# Patient Record
Sex: Male | Born: 1960
Health system: Southern US, Community
[De-identification: ages and names within clinical notes are randomized; demographics above are authoritative.]

## PROBLEM LIST (undated history)

## (undated) DIAGNOSIS — I10 Essential (primary) hypertension: Secondary | ICD-10-CM

## (undated) HISTORY — DX: Essential (primary) hypertension: I10

---

## 1996-06-09 HISTORY — PX: VASECTOMY: SHX75

## 2006-11-01 ENCOUNTER — Emergency Department: Payer: Self-pay | Admitting: Internal Medicine

## 2009-05-11 ENCOUNTER — Emergency Department: Payer: Self-pay | Admitting: Emergency Medicine

## 2012-05-21 ENCOUNTER — Emergency Department: Payer: Self-pay | Admitting: Emergency Medicine

## 2012-05-21 LAB — BASIC METABOLIC PANEL
BUN: 17 mg/dL (ref 7–18)
Calcium, Total: 8.6 mg/dL (ref 8.5–10.1)
Chloride: 109 mmol/L — ABNORMAL HIGH (ref 98–107)
Co2: 27 mmol/L (ref 21–32)
Creatinine: 1.37 mg/dL — ABNORMAL HIGH (ref 0.60–1.30)
EGFR (African American): >60
EGFR (Non-African Amer.): >60
Glucose: 118 mg/dL — ABNORMAL HIGH (ref 65–99)
Osmolality: 282 (ref 275–301)
Sodium: 140 mmol/L (ref 136–145)

## 2012-05-21 LAB — CBC
HGB: 15.6 g/dL (ref 13.0–18.0)
MCH: 31.8 pg (ref 26.0–34.0)
MCHC: 35.1 g/dL (ref 32.0–36.0)
Platelet: 172 10*3/uL (ref 150–440)
RBC: 4.9 10*6/uL (ref 4.40–5.90)
RDW: 13.1 % (ref 11.5–14.5)

## 2012-05-21 LAB — TROPONIN I: Troponin-I: <0.02 ng/mL

## 2012-05-21 LAB — CK TOTAL AND CKMB (NOT AT ARMC)
CK, Total: 383 U/L — ABNORMAL HIGH (ref 35–232)
CK-MB: 5.8 ng/mL — ABNORMAL HIGH (ref 0.5–3.6)

## 2013-11-04 LAB — BASIC METABOLIC PANEL
BUN: 13 mg/dL (ref 4–21)
Creatinine: 1.2 mg/dL (ref <=1.3)
GLUCOSE: 99 mg/dL
POTASSIUM: 5 mmol/L (ref 3.4–5.3)
SODIUM: 142 mmol/L (ref 137–147)

## 2013-11-04 LAB — LIPID PANEL
Cholesterol: 127 mg/dL (ref 0–200)
HDL: 35 mg/dL (ref 35–70)
LDL Cholesterol: 68 mg/dL
Triglycerides: 121 mg/dL (ref 40–160)

## 2013-11-04 LAB — HEPATIC FUNCTION PANEL
ALT: 89 U/L — AB (ref 10–40)
AST: 56 U/L — AB (ref 14–40)
Alkaline Phosphatase: 71 U/L (ref 25–125)
BILIRUBIN, TOTAL: 0.6 mg/dL

## 2013-11-04 LAB — PSA: PSA: 0.3

## 2013-11-04 LAB — TSH: TSH: 3.5 u[IU]/mL (ref <=5.90)

## 2013-11-16 ENCOUNTER — Ambulatory Visit: Payer: Self-pay | Admitting: Family Medicine

## 2013-12-14 ENCOUNTER — Ambulatory Visit: Payer: Self-pay | Admitting: Unknown Physician Specialty

## 2014-03-12 DIAGNOSIS — I776 Arteritis, unspecified: Secondary | ICD-10-CM | POA: Insufficient documentation

## 2015-01-04 ENCOUNTER — Other Ambulatory Visit: Payer: Self-pay | Admitting: Family Medicine

## 2015-01-04 DIAGNOSIS — I10 Essential (primary) hypertension: Secondary | ICD-10-CM

## 2015-05-12 ENCOUNTER — Other Ambulatory Visit: Payer: Self-pay | Admitting: Family Medicine

## 2015-05-15 ENCOUNTER — Other Ambulatory Visit: Payer: Self-pay | Admitting: Family Medicine

## 2015-05-15 DIAGNOSIS — I10 Essential (primary) hypertension: Secondary | ICD-10-CM

## 2015-05-15 MED ORDER — LISINOPRIL 10 MG PO TABS: 10.0000 mg | ORAL_TABLET | Freq: Every day | ORAL | Status: DC

## 2015-11-16 IMAGING — CT CT ABDOMEN W/ CM
2 of 5 series · 17 of 46 positions shown, 19 images · IV contrast (isovue)
Comparison: Ultrasound exam from 11/16/2013.

CLINICAL DATA: Right upper quadrant pain.

EXAM:
CT ABDOMEN WITH CONTRAST
TECHNIQUE: Multidetector CT imaging of the abdomen was performed using the
standard protocol following bolus administration of intravenous
contrast.
CONTRAST:  100 cc Isovue 370.

[Series 2: axial soft tissue · axial · 0.78mm/px · z∈[-362,-107]mm · 14 of 59 slices shown, 16 images]
[im 4/59  soft-tissue]
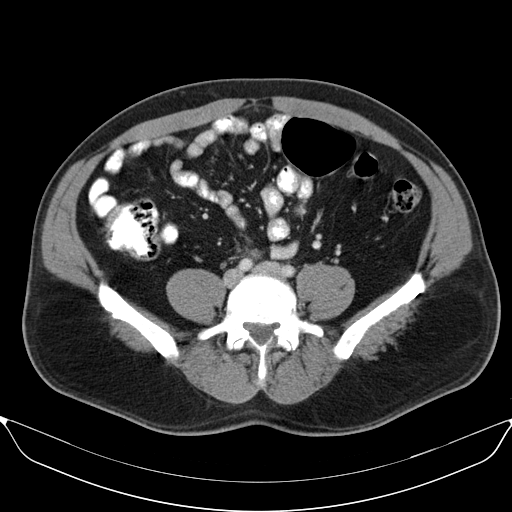
[im 4/59  bone]
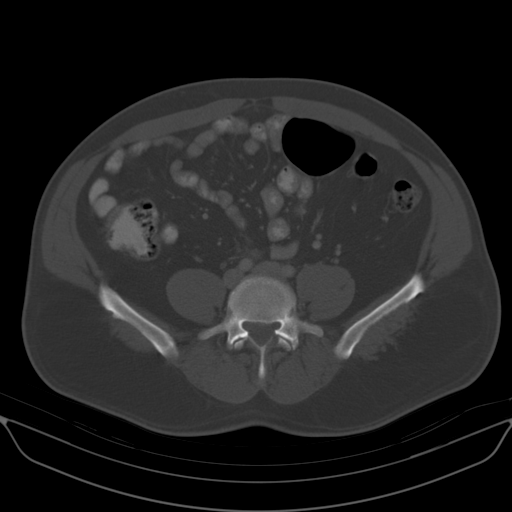
[im 7/59  soft-tissue]
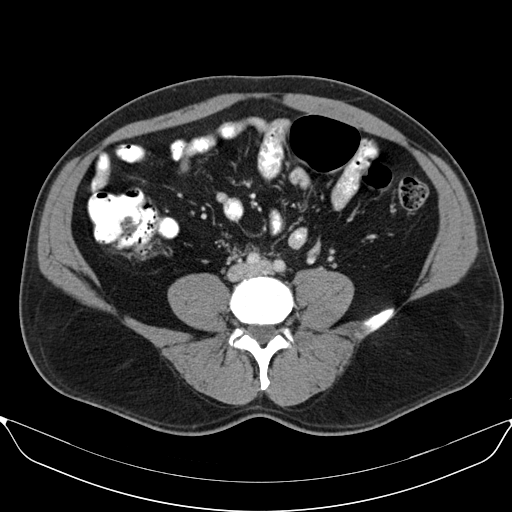
[im 11/59  soft-tissue]
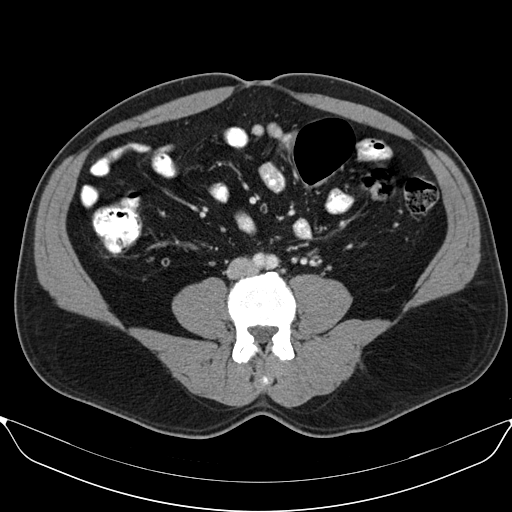
[im 18/59  soft-tissue]
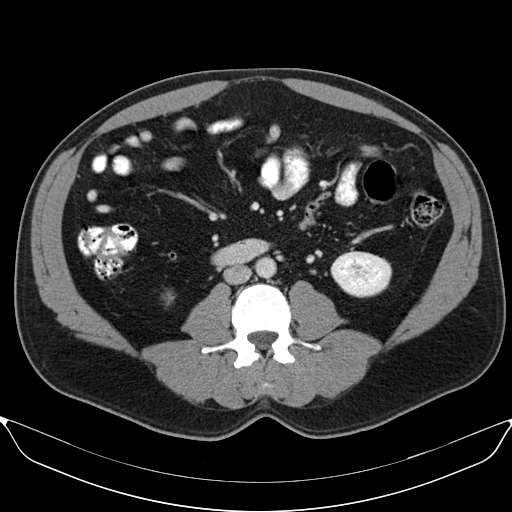
[im 21/59  soft-tissue]
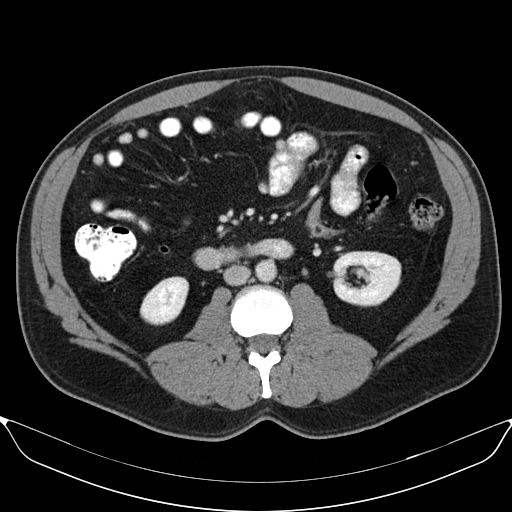
[im 24/59  soft-tissue]
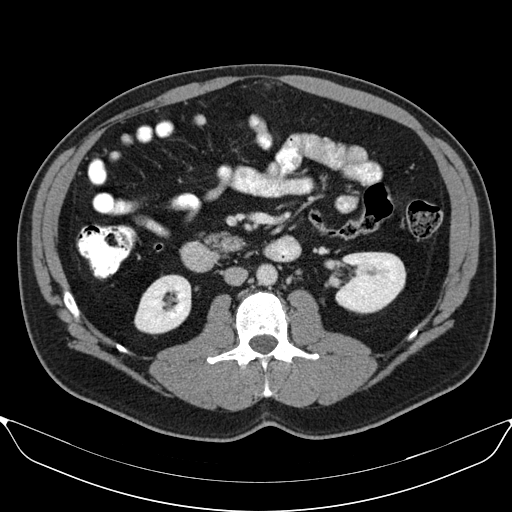
[im 28/59  soft-tissue]
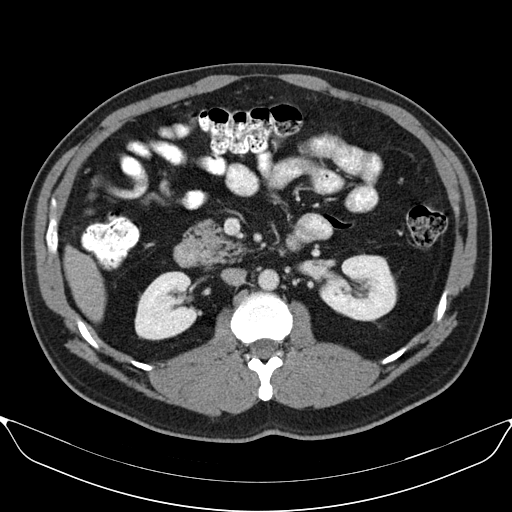
[im 31/59  soft-tissue]
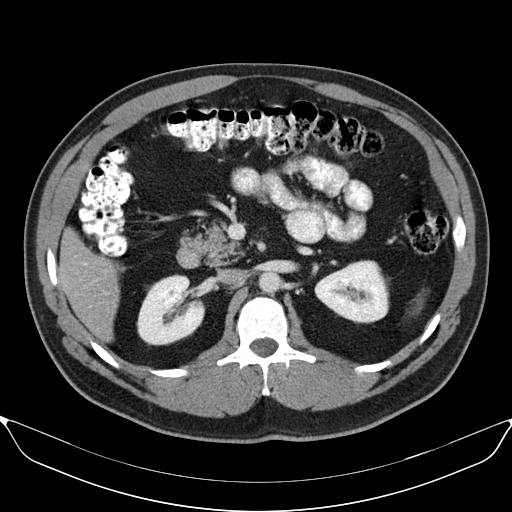
[im 35/59  soft-tissue]
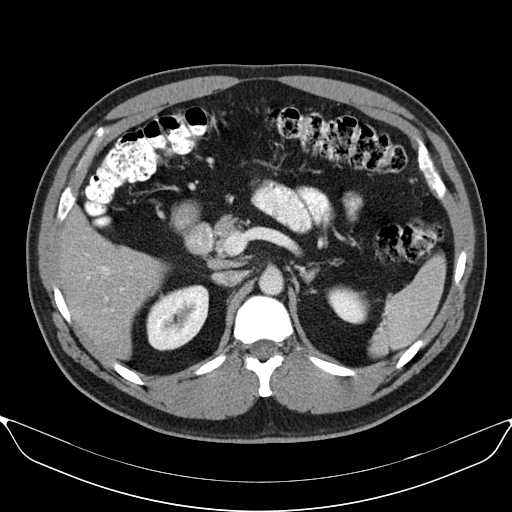
[im 35/59  bone]
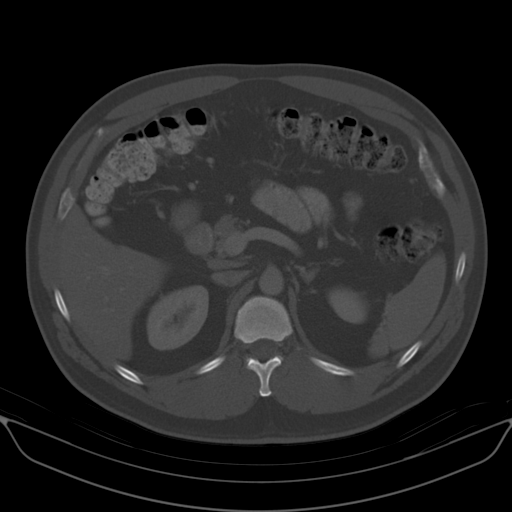
[im 38/59  soft-tissue]
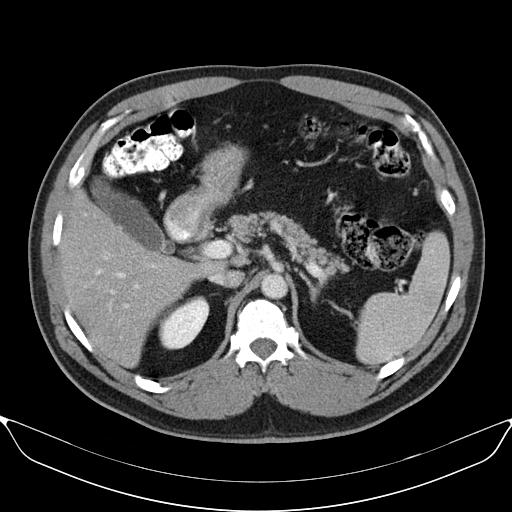
[im 45/59  soft-tissue]
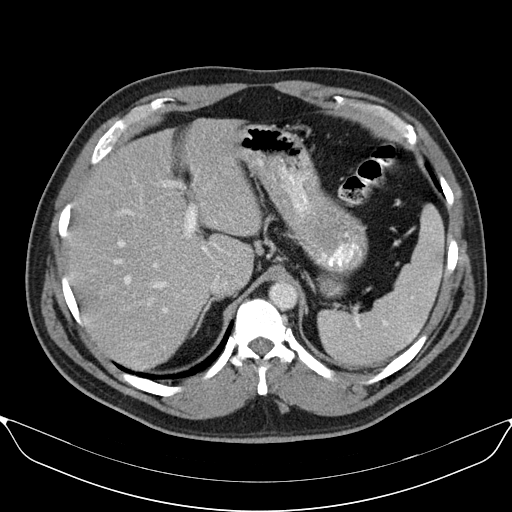
[im 48/59  soft-tissue]
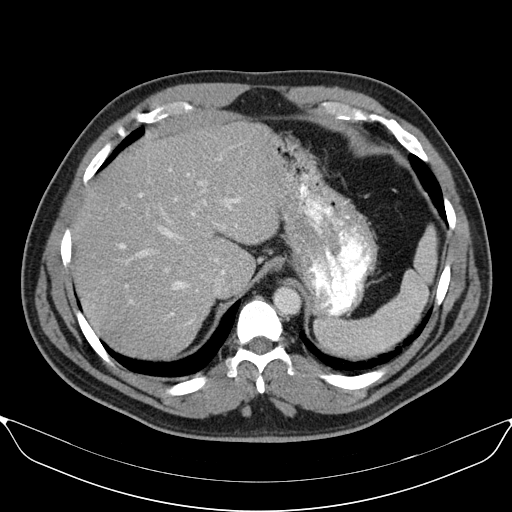
[im 52/59  soft-tissue]
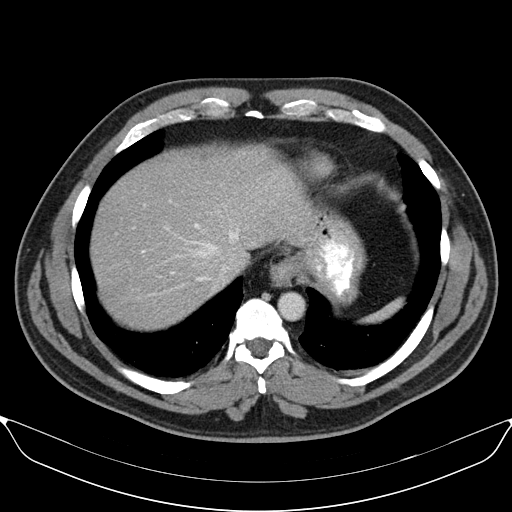
[im 55/59  soft-tissue]
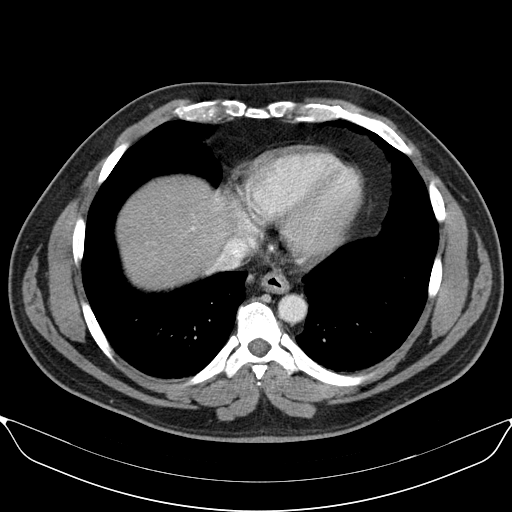

[Series 602: coronal · coronal · 0.78mm/px · 3 of 119 slices shown]
[im 40/119  soft-tissue]
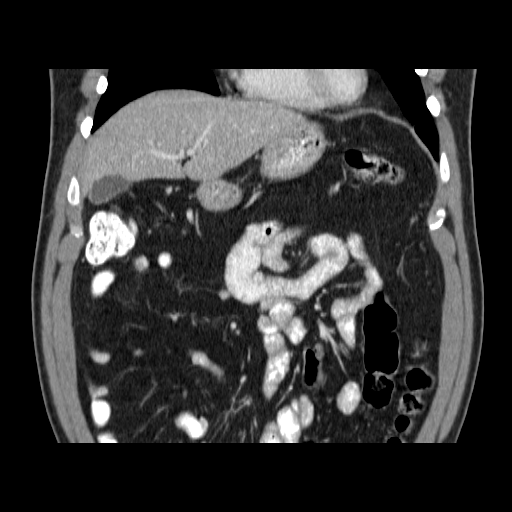
[im 53/119  soft-tissue]
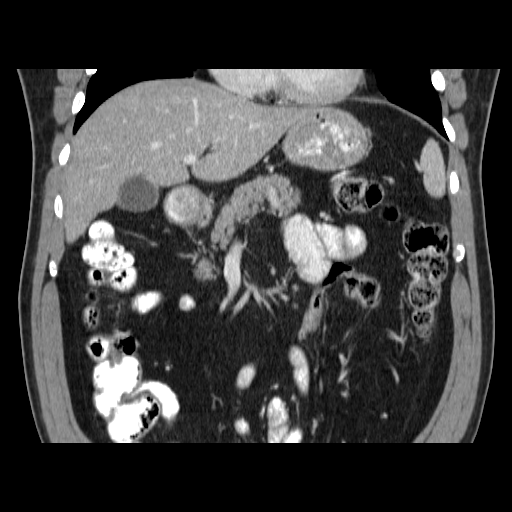
[im 66/119  soft-tissue]
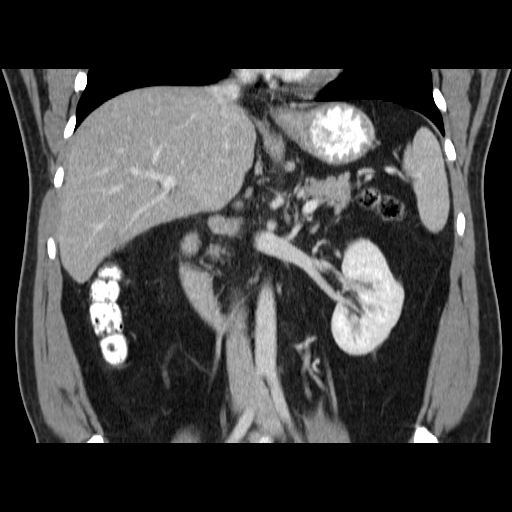

[17 of 46 positions shown; findings below may reference images not displayed]

FINDINGS: Lung Bases: Move

Liver: Normal size at 15.5 cm craniocaudal length. No focal
intraparenchymal abnormality.

Spleen: Normal.

Stomach: Normal appearance for the degree of distension.

Pancreas: No focal mass lesion. No dilatation of the main pancreatic
duct. No peripancreatic edema or inflammation.

Gallbladder/Biliary: No evidence for gallstones. No intra or
extrahepatic biliary duct dilatation.

Kidneys/Adrenals: No adrenal nodule or mass. No enhancing mass
lesion in either kidney. No hydronephrosis.

Bowel Loops: Duodenum is normal. No evidence for small bowel
obstruction within the visualized abdomen. Terminal ileum and
appendix are located cranial to the right iliac crest and are well
visualized with normal features on today's study. The abdominal
segments of the colon are normal.

Nodes: No abdominal lymphadenopathy.

Vasculature: No abdominal aortic aneurysm.

Bones/Musculoskeletal: Bone windows reveal no worrisome lytic or
sclerotic osseous lesions.

Body Wall: No evidence for abdominal wall hernia.

Other: No intraperitoneal free fluid in the abdomen.
IMPRESSION: No findings in the abdomen explain the patient's history of right
upper quadrant pain.

## 2015-12-03 DIAGNOSIS — I10 Essential (primary) hypertension: Secondary | ICD-10-CM | POA: Insufficient documentation

## 2015-12-03 DIAGNOSIS — B009 Herpesviral infection, unspecified: Secondary | ICD-10-CM | POA: Insufficient documentation

## 2015-12-03 DIAGNOSIS — R202 Paresthesia of skin: Secondary | ICD-10-CM | POA: Insufficient documentation

## 2015-12-03 DIAGNOSIS — R748 Abnormal levels of other serum enzymes: Secondary | ICD-10-CM | POA: Insufficient documentation

## 2015-12-03 DIAGNOSIS — Z8669 Personal history of other diseases of the nervous system and sense organs: Secondary | ICD-10-CM | POA: Insufficient documentation

## 2015-12-03 DIAGNOSIS — L309 Dermatitis, unspecified: Secondary | ICD-10-CM | POA: Insufficient documentation

## 2015-12-03 DIAGNOSIS — K219 Gastro-esophageal reflux disease without esophagitis: Secondary | ICD-10-CM | POA: Insufficient documentation

## 2015-12-03 DIAGNOSIS — I2510 Atherosclerotic heart disease of native coronary artery without angina pectoris: Secondary | ICD-10-CM | POA: Insufficient documentation

## 2015-12-03 DIAGNOSIS — Z8349 Family history of other endocrine, nutritional and metabolic diseases: Secondary | ICD-10-CM | POA: Insufficient documentation

## 2016-01-09 ENCOUNTER — Emergency Department
Admission: EM | Admit: 2016-01-09 | Discharge: 2016-01-09 | Disposition: A | Discharge status: Home / Self Care | Payer: 59 | Attending: Emergency Medicine | Admitting: Emergency Medicine

## 2016-01-09 ENCOUNTER — Emergency Department: Payer: 59

## 2016-01-09 ENCOUNTER — Encounter: Payer: Self-pay | Admitting: *Deleted

## 2016-01-09 DIAGNOSIS — W11XXXA Fall on and from ladder, initial encounter: Secondary | ICD-10-CM | POA: Diagnosis not present

## 2016-01-09 DIAGNOSIS — S32009A Unspecified fracture of unspecified lumbar vertebra, initial encounter for closed fracture: Secondary | ICD-10-CM | POA: Diagnosis not present

## 2016-01-09 DIAGNOSIS — Z7982 Long term (current) use of aspirin: Secondary | ICD-10-CM | POA: Diagnosis not present

## 2016-01-09 DIAGNOSIS — Y929 Unspecified place or not applicable: Secondary | ICD-10-CM | POA: Insufficient documentation

## 2016-01-09 DIAGNOSIS — I10 Essential (primary) hypertension: Secondary | ICD-10-CM | POA: Insufficient documentation

## 2016-01-09 DIAGNOSIS — Y999 Unspecified external cause status: Secondary | ICD-10-CM | POA: Insufficient documentation

## 2016-01-09 DIAGNOSIS — Z79899 Other long term (current) drug therapy: Secondary | ICD-10-CM | POA: Diagnosis not present

## 2016-01-09 DIAGNOSIS — Y939 Activity, unspecified: Secondary | ICD-10-CM | POA: Insufficient documentation

## 2016-01-09 DIAGNOSIS — S3992XA Unspecified injury of lower back, initial encounter: Secondary | ICD-10-CM | POA: Diagnosis present

## 2016-01-09 LAB — URINALYSIS COMPLETE WITH MICROSCOPIC (ARMC ONLY)
BACTERIA UA: NONE SEEN
BILIRUBIN URINE: NEGATIVE
GLUCOSE, UA: 50 mg/dL — AB
HGB URINE DIPSTICK: NEGATIVE
Ketones, ur: NEGATIVE mg/dL
LEUKOCYTES UA: NEGATIVE
NITRITE: NEGATIVE
Protein, ur: NEGATIVE mg/dL
RBC / HPF: NONE SEEN RBC/hpf (ref 0–5)
SPECIFIC GRAVITY, URINE: 1.023 (ref 1.005–1.030)
Squamous Epithelial / LPF: NONE SEEN
pH: 6 (ref 5.0–8.0)

## 2016-01-09 MED ORDER — HYDROMORPHONE HCL 2 MG PO TABS: 2.0000 mg | ORAL_TABLET | Freq: Two times a day (BID) | ORAL | 0 refills | Status: DC | PRN

## 2016-01-09 MED ORDER — LIDOCAINE 5 % EX PTCH: 1.0000 | MEDICATED_PATCH | CUTANEOUS | 0 refills | Status: AC

## 2016-01-09 MED ORDER — LIDOCAINE 5 % EX PTCH
1.0000 | MEDICATED_PATCH | CUTANEOUS | Status: DC
Start: 2016-01-09 — End: 2016-01-10
  Administered 2016-01-09: 1 via TRANSDERMAL
  Filled 2016-01-09: qty 1

## 2016-01-09 MED ORDER — IBUPROFEN 800 MG PO TABS: 800.0000 mg | ORAL_TABLET | Freq: Three times a day (TID) | ORAL | 0 refills | Status: AC | PRN

## 2016-01-09 NOTE — ED Triage Notes (Signed)
Pt was cleaning gutters and fell 10 feet down landing on his back, states back and "kidney " pain, denies any LOC, denies hitting his head, awake upon arrival

## 2016-01-09 NOTE — Discharge Instructions (Signed)
Please return immediately if condition worsens. Please contact her primary physician or the physician you were given for referral. If you have any specialist physicians involved in her treatment and plan please also contact them. Thank you for using [facility]. ° °

## 2016-01-09 NOTE — ED Provider Notes (Signed)
Time Seen: Approximately 1956  I have reviewed the triage notes  Chief Complaint: Fall   History of Present Illness: Jacob Lee is a 55 y.o. male *who presents after a non-syncopal fall from a ladder approximately 10 feet up in the air. Fall was approximately 1 hour prior to arrival. Patient states he fell backwards off a ladder and landed primarily flat on his back and did strike a lawn chair on the way to the ground. He denies any head trauma, neck pain. He was able to get up and ambulate after his fall. He is primarily complaining of pain over the left flank area and lower middle back region. He denies any radiation of pain in the lower extremities. History reviewed. No pertinent past medical history.  Patient Active Problem List   Diagnosis Date Noted  . Abnormal liver enzymes 12/03/2015  . Coronary atherosclerosis 12/03/2015  . Dermatitis, eczematoid 12/03/2015  . Eczema of hand 12/03/2015  . Family history of hypothyroidism 12/03/2015  . Acid reflux 12/03/2015  . H/O Bell's palsy 12/03/2015  . Herpes simplex 12/03/2015  . BP (high blood pressure) 12/03/2015  . Leg paresthesia 12/03/2015  . Vasculitis ([place]) 03/12/2014    Past Surgical History:  Procedure Laterality Date  . VASECTOMY  1998    Past Surgical History:  Procedure Laterality Date  . VASECTOMY  1998    Current Outpatient Rx  . Order #: [id number] Class: Historical Med  . Order #: [id number] Class: Historical Med  . Order #: [id number] Class: Normal    Allergies:  Cefdinir; Codeine; and Sudafed  [pseudoephedrine hcl]  Family History: Family History  Problem Relation Age of Onset  . Heart disease Mother   . Allergic rhinitis Mother   . Emphysema Mother   . Heart disease Father   . Hypertension Father   . Diabetes Father   . Allergic rhinitis Father   . Heart disease Brother   . Cancer Maternal Uncle     prostate cancer  . Hypertension Brother     Social History: Social History   Substance Use Topics  . Smoking status: Never Smoker  . Smokeless tobacco: Not on file  . Alcohol use No     Review of Systems:   10 point review of systems was performed and was otherwise negative:  Constitutional: No fever Eyes: No visual disturbances ENT: No sore throat, ear pain Cardiac: No chest pain Respiratory: No shortness of breath, wheezing, or stridor Abdomen: No abdominal pain, no vomiting, No diarrhea Endocrine: No weight loss, No night sweats Extremities: No peripheral edema, cyanosis Skin: No rashes, easy bruising Neurologic: No focal weakness, trouble with speech or swollowing Urologic: No dysuria, Hematuria, or urinary frequency   Physical Exam:  ED Triage Vitals [01/09/16 1807]  Enc Vitals Group     BP 133/88     Pulse Rate 71     Resp 18     Temp 97.6 F (36.4 C)     Temp Source Oral     SpO2 96 %     Weight 224 lb (101.6 kg)     Height 5\' 8"  (1.727 m)     Head Circumference      Peak Flow      Pain Score 8     Pain Loc      Pain Edu?      Excl. in [place]?     General: Awake , Alert , and Oriented times 3; GCS 15 Trauma score 16 Head: Normal cephalic , atraumatic  Eyes: Pupils equal , round, reactive to light Nose/Throat: No nasal drainage, patent upper airway without erythema or exudate.  Neck: Supple, Full range of motion, No anterior adenopathy or palpable thyroid masses. No midline cervical spine pain Lungs: Clear to ascultation without wheezes , rhonchi, or rales Heart: Regular rate, regular rhythm without murmurs , gallops , or rubs Abdomen: Soft, non tender without rebound, guarding , or rigidity; bowel sounds positive and symmetric in all 4 quadrants. No organomegaly .        Extremities: Full range of motion of both lower extremities without any pain or crepitus Neurologic: normal ambulation, Motor symmetric without deficits, sensory intact Skin: warm, dry, no rashes Examination of the back shows some pain over the left flank area along  with lower middle lumbar region without crepitus or step-off noted Pelvis is stable Labs:   All laboratory work was reviewed including any pertinent negatives or positives listed below:  Labs Reviewed  [place] ([place])    Radiology: CLINICAL DATA:  55 year old male who fell while cleaning gutters landing on to back. Initial encounter.  EXAM: LUMBAR SPINE - 2-3 VIEW  COMPARISON:  Thoracic spine series from today reported separately. CT Abdomen 12/14/2013.  FINDINGS: Normal lumbar segmentation. Straightening of lumbar lordosis. Otherwise normal vertebral height and alignment. Relatively preserved disc spaces. Questionable nondisplaced left L1 transverse process fracture. No other lumbar fracture identified. Sacral ala and SI joints appear intact.  IMPRESSION: 1. Suspected nondisplaced left L1 transverse process fracture (a stable injury). 2. No other acute fracture or dislocation identified in the lumbar spine. EXAM: THORACIC SPINE 2 VIEWS  COMPARISON:  Portable chest 05/21/2012.  FINDINGS: Bone mineralization is within normal limits. Normal thoracic segmentation. Cervicothoracic junction alignment is within normal limits. Normal thoracic vertebral height and alignment aside from mild dextro convex curvature. Posterior ribs appear intact. Grossly normal visualized thoracic visceral contours. Visible upper lumbar levels appear intact.  IMPRESSION: No acute fracture or listhesis identified in the thoracic spine.   Electronically Signed   By: Genevie Ann M.D.   On: 01/09/2016 19:10 I personally reviewed the radiologic studies     ED Course:   Patient's ED course was uneventful. He refuses most of the pain medication was offered and accept for lidocaine patch which we applied it only gave him some mild pain relief. Neurologic signs and his urine was negative for blood and I felt it was unlikely that he had some form of  intraperitoneal injury at this time.    Clinical Course     Assessment: * Transverse process fracture L1     Plan: * Outpatient Prescriptions for ibuprofen, lidocaine patch, and Dilaudid Patient was advised to return immediately if condition worsens. Patient was advised to follow up with their primary care physician or other specialized physicians involved in their outpatient care. The patient and/or family member/power of attorney had laboratory results reviewed at the bedside. All questions and concerns were addressed and appropriate discharge instructions were distributed by the nursing staff.             Jacob Larsen, MD 01/09/16 2102

## 2016-01-14 ENCOUNTER — Encounter: Payer: Self-pay | Admitting: Family Medicine

## 2016-01-14 ENCOUNTER — Ambulatory Visit (INDEPENDENT_AMBULATORY_CARE_PROVIDER_SITE_OTHER): Payer: 59 | Admitting: Family Medicine

## 2016-01-14 VITALS — BP 126/78 | HR 82 | Temp 98.0°F | Resp 16 | Wt 215.2 lb

## 2016-01-14 DIAGNOSIS — S32008D Other fracture of unspecified lumbar vertebra, subsequent encounter for fracture with routine healing: Secondary | ICD-10-CM | POA: Diagnosis not present

## 2016-01-14 MED ORDER — OXYCODONE HCL 5 MG PO TABS: ORAL_TABLET | ORAL | 0 refills | Status: DC

## 2016-01-14 NOTE — Progress Notes (Signed)
Subjective:     Patient ID: Jacob Lee, male   DOB: 21-Aug-1960, 55 y.o.   MRN: [id number]  HPI  Chief Complaint  Patient presents with  . Hospitalization Follow-up    Patient comes in office today for hospital follow up. Patient was seen at [facility] on 01/09/16 after sustaining a 74foot fall off a ladder. Patient states that he was cleaning gutters and ladder was half resting on concrete and on ground, patient states he over reached and had fell straight back due to imbalance of ladder.   States he continues to significant pain with movement despite taking ibuprofen 800 mg 3 x day and Tylenol once a day. States pain medication from the ER made him nauseous and he discontinued it.   Review of Systems     Objective:   Physical Exam  Constitutional: He appears well-developed and well-nourished. He appears distressed (moderate pain with movement; standing on presentation).  Musculoskeletal:  Localizes pain to his left lumbar area coincident with x-ray findings       Assessment:    1. Lumbar transverse process fracture, with routine healing, subsequent encounter - oxyCODONE (OXY IR/ROXICODONE) 5 MG immediate release tablet; 1/2 to one every 4-6 hours as needed for pain  Dispense: 30 tablet; Refill: 0    Plan:    Continue nsaid's and  Tylenol up to 3000 mg./24 hours. Work excuse for 8/-17-8/14 pending next f/u visit.

## 2016-01-14 NOTE — Patient Instructions (Signed)
Continue ibuprofen 800 mg 3 x day with food; May add Tylenol extra strength 500 mg. Two pills 3 x day (do not exceed 3000 mg./day)

## 2016-01-18 ENCOUNTER — Encounter: Payer: Self-pay | Admitting: Family Medicine

## 2016-01-18 ENCOUNTER — Ambulatory Visit (INDEPENDENT_AMBULATORY_CARE_PROVIDER_SITE_OTHER): Payer: 59 | Admitting: Family Medicine

## 2016-01-18 VITALS — BP 122/84 | HR 83 | Temp 98.3°F | Resp 15 | Wt 210.6 lb

## 2016-01-18 DIAGNOSIS — S32008D Other fracture of unspecified lumbar vertebra, subsequent encounter for fracture with routine healing: Secondary | ICD-10-CM

## 2016-01-18 NOTE — Patient Instructions (Signed)
We will call you about the referral. 

## 2016-01-18 NOTE — Progress Notes (Signed)
Subjective:     Patient ID: Jacob Lee, male   DOB: 1961/03/06, 55 y.o.   MRN: [id number]  HPI  Chief Complaint  Patient presents with  . Back Pain    Patient returns for four day follow up, at last visit patient was started on oxycodone 5mg  and advised to continue Nsaids & Tylenol. Patient states that he is in pain and is worse when he takes deep breaths, patient states that he has sharp pain in his lower back on the left side and is unable to bend down.   He is anxious to return to work as soon as possible and asks about a brace for his back. Tolerating oxycodone at 1/2 pill without significant nausea.   Review of Systems     Objective:   Physical Exam  Constitutional: He appears well-developed and well-nourished. No distress (standing during visit.).       Assessment:    1. Lumbar transverse process fracture, with routine healing, subsequent encounter - Ambulatory referral to Orthopedic Surgery    Plan:    work excuse from 8/14-8/21/17 pending orthopedic evaluation. Continue pain medication as needed. ADL's within the limitation of his pain.

## 2016-01-23 ENCOUNTER — Telehealth: Payer: Self-pay | Admitting: Family Medicine

## 2016-01-23 NOTE — Telephone Encounter (Signed)
Pt is requesting a note to return to work.I advised him you would probably want the orthopaedic doctor to make this decision.Pt would like call today.Pt is aware you do leave early on Wednesdays

## 2016-01-23 NOTE — Telephone Encounter (Signed)
Patient was last seen by you on 01/18/16. KW

## 2016-01-23 NOTE — Telephone Encounter (Signed)
States he is going back to orthopedics tomorrow to get clearance to return to work. Reports he is moving much better and is not on on ibuprofen or narcotic pain medication.

## 2016-02-07 ENCOUNTER — Other Ambulatory Visit: Payer: Self-pay | Admitting: Family Medicine

## 2016-02-07 DIAGNOSIS — I10 Essential (primary) hypertension: Secondary | ICD-10-CM

## 2016-10-05 ENCOUNTER — Other Ambulatory Visit: Payer: Self-pay | Admitting: Family Medicine

## 2016-10-05 DIAGNOSIS — I10 Essential (primary) hypertension: Secondary | ICD-10-CM

## 2016-10-09 ENCOUNTER — Encounter: Payer: Self-pay | Admitting: Family Medicine

## 2016-10-09 ENCOUNTER — Ambulatory Visit (INDEPENDENT_AMBULATORY_CARE_PROVIDER_SITE_OTHER): Payer: 59 | Admitting: Family Medicine

## 2016-10-09 VITALS — BP 124/80 | HR 81 | Temp 98.3°F | Resp 16 | Wt 219.2 lb

## 2016-10-09 DIAGNOSIS — Z13220 Encounter for screening for lipoid disorders: Secondary | ICD-10-CM | POA: Diagnosis not present

## 2016-10-09 DIAGNOSIS — L720 Epidermal cyst: Secondary | ICD-10-CM | POA: Diagnosis not present

## 2016-10-09 DIAGNOSIS — I10 Essential (primary) hypertension: Secondary | ICD-10-CM

## 2016-10-09 DIAGNOSIS — Z125 Encounter for screening for malignant neoplasm of prostate: Secondary | ICD-10-CM

## 2016-10-09 NOTE — Progress Notes (Signed)
Subjective:     Patient ID: Jacob Lee, male   DOB: 01/22/1961, 56 y.o.   MRN: [id number]  HPI  Chief Complaint  Patient presents with  . Skin Problem    Patient comes in office today with conerns of knot/bump that appeared on the inside of his buttock on the right side 3 weeks ago. Patient denies changes to area getting larger or painful.   . Hypertension    Pastient comes in office for follow up, last office visit was 11/01/13. Patient blood pressure at visit was 112/84, patient was advised to continue Lisinopril 10mg . Patient reports good compliance and tolerance.   States knot is painless. Discussed updating labs.   Review of Systems  Respiratory: Negative for shortness of breath.   Cardiovascular: Negative for chest pain and palpitations.  Gastrointestinal:       Reports colonoscopy 2 years ago.  Genitourinary:       No nocturia       Objective:   Physical Exam  Constitutional: He appears well-developed and well-nourished. No distress.  Cardiovascular: Normal rate and regular rhythm.   Pulmonary/Chest: Breath sounds normal.  Musculoskeletal: He exhibits no edema (of lower extremities).  Skin:  Right inner buttock with 1 cm subcutaneous, mobile cyst.       Assessment:    1. Epidermoid cyst of skin: discussed observation; signs and symptoms of inflammation/infection  2. Essential hypertension - Comprehensive metabolic panel  3. Screening for cholesterol level - Lipid panel  4. Screening for prostate cancer - PSA    Plan:    Further f/u pending lab work

## 2016-10-09 NOTE — Patient Instructions (Signed)
Please get fasting labs

## 2016-10-24 ENCOUNTER — Encounter: Payer: Self-pay | Admitting: Family Medicine

## 2016-10-24 DIAGNOSIS — K7581 Nonalcoholic steatohepatitis (NASH): Secondary | ICD-10-CM | POA: Insufficient documentation

## 2016-10-24 LAB — COMPREHENSIVE METABOLIC PANEL
A/G RATIO: 1.6 (ref 1.2–2.2)
ALK PHOS: 51 IU/L (ref 39–117)
ALT: 38 IU/L (ref 0–44)
AST: 37 IU/L (ref 0–40)
Albumin: 4.4 g/dL (ref 3.5–5.5)
BUN/Creatinine Ratio: 13 (ref 9–20)
BUN: 17 mg/dL (ref 6–24)
Bilirubin Total: 0.5 mg/dL (ref 0.0–1.2)
CALCIUM: 9.6 mg/dL (ref 8.7–10.2)
CO2: 25 mmol/L (ref 18–29)
Chloride: 102 mmol/L (ref 96–106)
Creatinine, Ser: 1.29 mg/dL — ABNORMAL HIGH (ref 0.76–1.27)
GFR calc Af Amer: 72 mL/min/{1.73_m2} (ref >59)
GFR, EST NON AFRICAN AMERICAN: 62 mL/min/{1.73_m2} (ref >59)
GLOBULIN, TOTAL: 2.7 g/dL (ref 1.5–4.5)
Glucose: 101 mg/dL — ABNORMAL HIGH (ref 65–99)
POTASSIUM: 5.1 mmol/L (ref 3.5–5.2)
SODIUM: 141 mmol/L (ref 134–144)
Total Protein: 7.1 g/dL (ref 6.0–8.5)

## 2016-10-24 LAB — LIPID PANEL
CHOLESTEROL TOTAL: 153 mg/dL (ref 100–199)
Chol/HDL Ratio: 3.9 ratio (ref 0.0–5.0)
HDL: 39 mg/dL — AB (ref >39)
LDL CALC: 92 mg/dL (ref 0–99)
TRIGLYCERIDES: 108 mg/dL (ref 0–149)
VLDL CHOLESTEROL CAL: 22 mg/dL (ref 5–40)

## 2016-10-24 LAB — PSA: Prostate Specific Ag, Serum: 0.4 ng/mL (ref 0.0–4.0)

## 2017-01-31 ENCOUNTER — Other Ambulatory Visit: Payer: Self-pay | Admitting: Family Medicine

## 2017-01-31 DIAGNOSIS — I10 Essential (primary) hypertension: Secondary | ICD-10-CM

## 2017-08-26 ENCOUNTER — Other Ambulatory Visit: Payer: Self-pay | Admitting: Family Medicine

## 2017-08-26 DIAGNOSIS — I10 Essential (primary) hypertension: Secondary | ICD-10-CM

## 2017-08-26 MED ORDER — LISINOPRIL 10 MG PO TABS: 10.0000 mg | ORAL_TABLET | Freq: Every day | ORAL | 1 refills | Status: DC

## 2017-08-26 NOTE — Telephone Encounter (Signed)
[facility] faxed a refill request for a 90-days supply for the following medication. Thanks CC  lisinopril (PRINIVIL,ZESTRIL) 10 MG tablet

## 2017-12-11 IMAGING — CR DG THORACIC SPINE 2V
1 series · 3 of 3 positions shown · non-contrast
Comparison: Portable chest 05/21/2012.

CLINICAL DATA: 55-year-old male who fell while cleaning gutters
landing on to back. Initial encounter.

EXAM:
THORACIC SPINE 2 VIEWS

[Series 1: dg thoracic spine 2 view · 0.14mm/px · 3 of 3 slices shown]
[im 1/3]
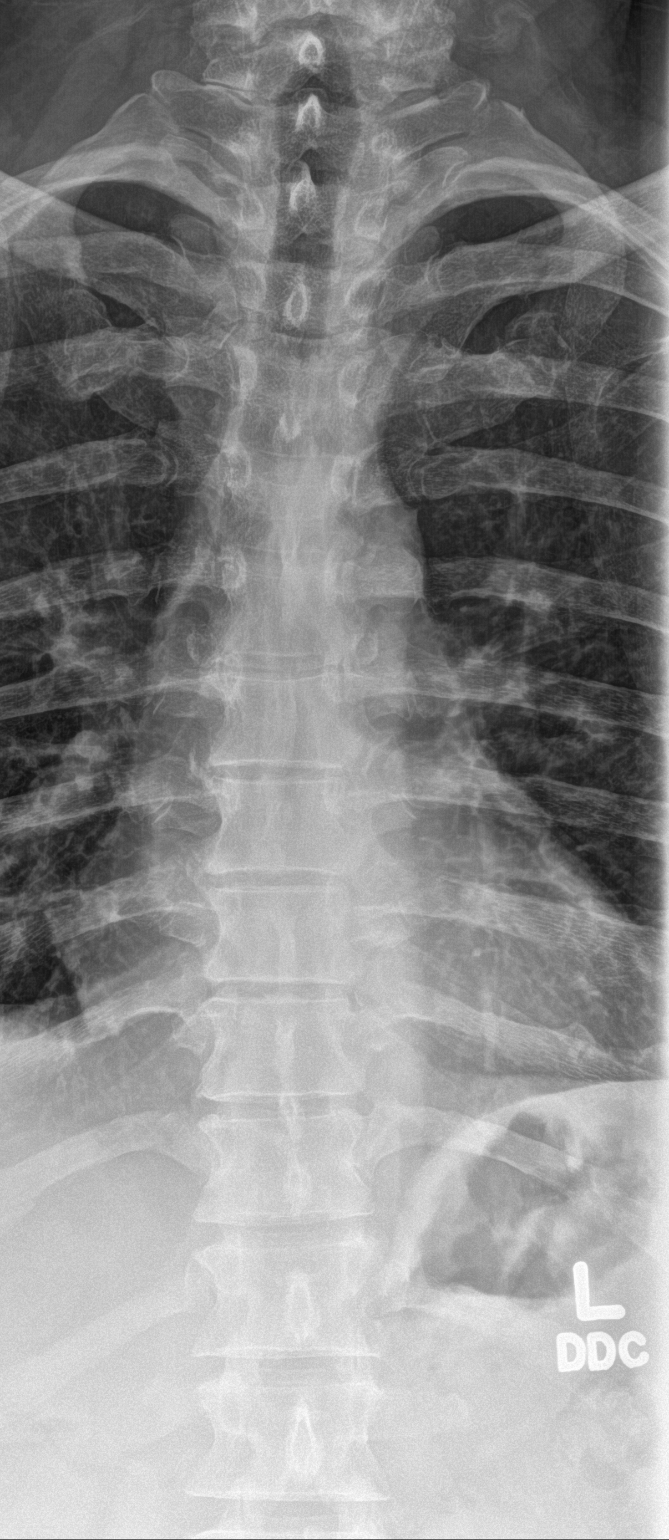
[im 2/3]
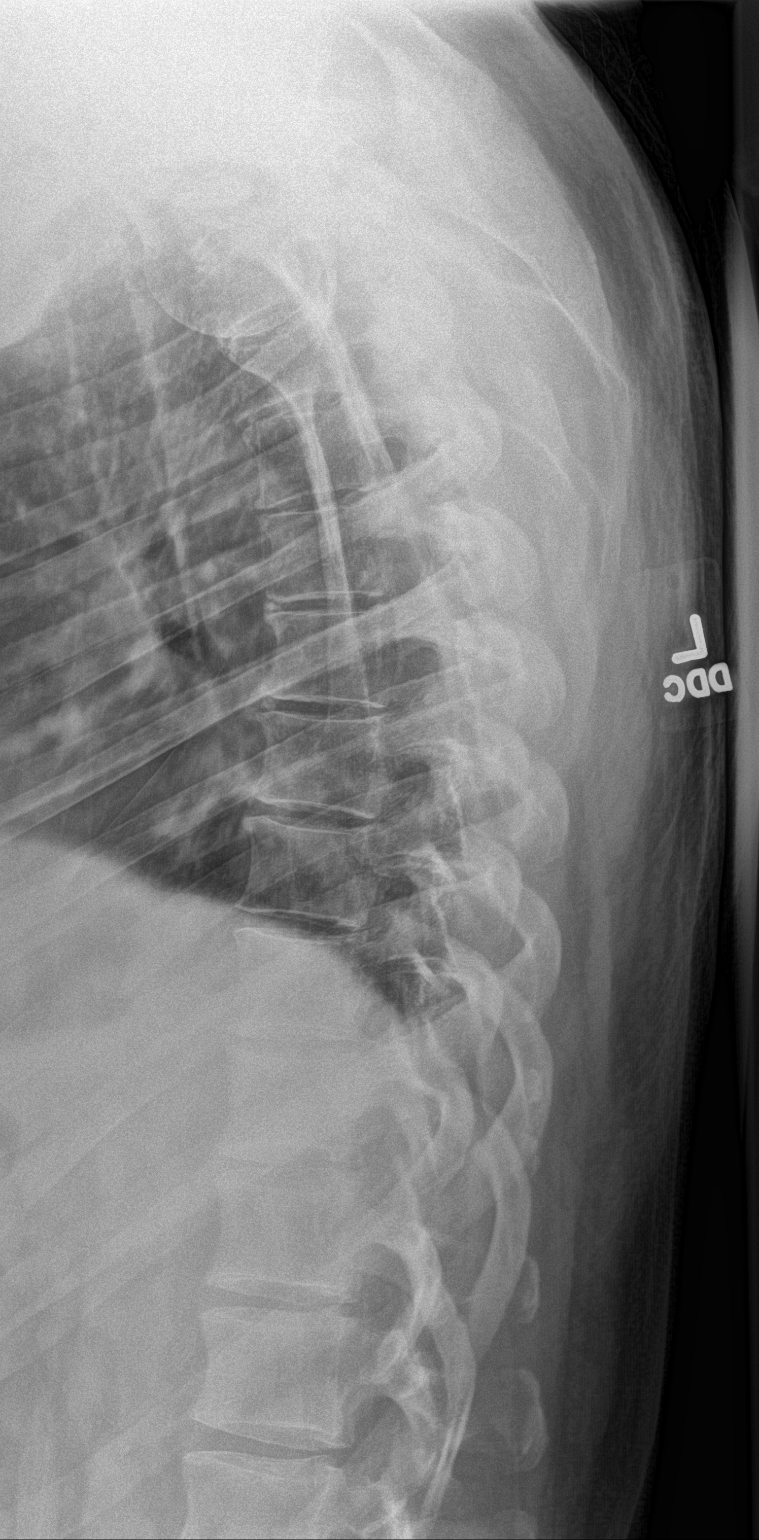
[im 3/3]
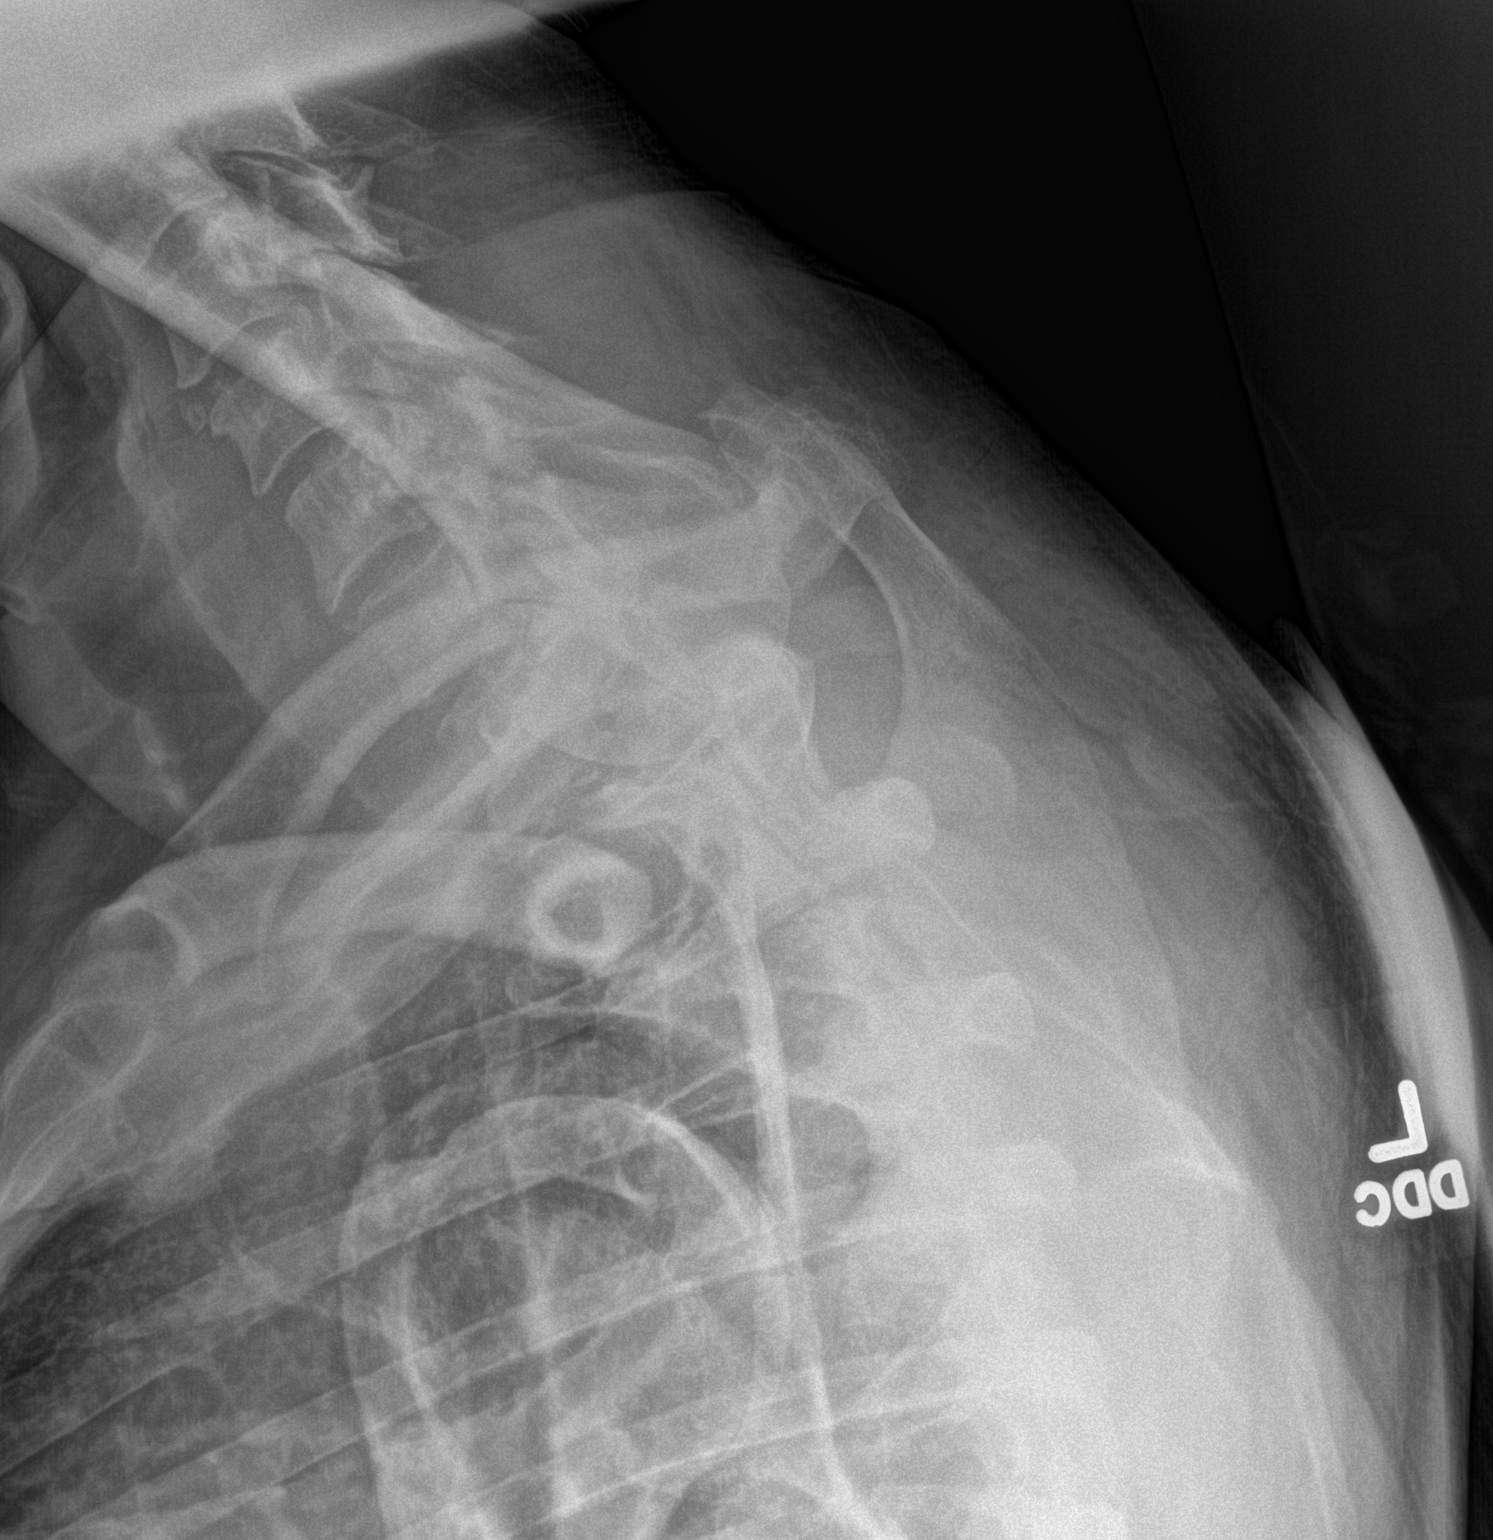

[3 of 3 positions shown; findings below may reference images not displayed]

FINDINGS: Bone mineralization is within normal limits. Normal thoracic
segmentation. Cervicothoracic junction alignment is within normal
limits. Normal thoracic vertebral height and alignment aside from
mild dextro convex curvature. Posterior ribs appear intact. Grossly
normal visualized thoracic visceral contours. Visible upper lumbar
levels appear intact.
IMPRESSION: No acute fracture or listhesis identified in the thoracic spine.

## 2018-01-22 ENCOUNTER — Telehealth: Payer: Self-pay

## 2018-01-22 NOTE — Telephone Encounter (Signed)
Please advise. KW 

## 2018-01-22 NOTE — Telephone Encounter (Signed)
Has concerns about E.D. Will schedule an appointment next week.

## 2018-01-22 NOTE — Telephone Encounter (Signed)
Patient called requesting to talk to Mikki Santee about a Air traffic controller. CB# [phone]

## 2018-03-30 ENCOUNTER — Other Ambulatory Visit: Payer: Self-pay | Admitting: Family Medicine

## 2018-03-30 DIAGNOSIS — I10 Essential (primary) hypertension: Secondary | ICD-10-CM

## 2018-04-05 ENCOUNTER — Other Ambulatory Visit: Payer: Self-pay

## 2018-04-05 ENCOUNTER — Encounter: Payer: Self-pay | Admitting: Family Medicine

## 2018-04-05 ENCOUNTER — Ambulatory Visit: Payer: 59 | Admitting: Family Medicine

## 2018-04-05 VITALS — BP 118/88 | HR 70 | Temp 98.1°F | Ht 68.0 in | Wt 192.6 lb

## 2018-04-05 DIAGNOSIS — Z125 Encounter for screening for malignant neoplasm of prostate: Secondary | ICD-10-CM

## 2018-04-05 DIAGNOSIS — H60542 Acute eczematoid otitis externa, left ear: Secondary | ICD-10-CM

## 2018-04-05 DIAGNOSIS — Z13220 Encounter for screening for lipoid disorders: Secondary | ICD-10-CM

## 2018-04-05 DIAGNOSIS — I10 Essential (primary) hypertension: Secondary | ICD-10-CM

## 2018-04-05 MED ORDER — MOMETASONE FUROATE 0.1 % EX SOLN: Freq: Every day | CUTANEOUS | 1 refills | Status: AC

## 2018-04-05 NOTE — Progress Notes (Signed)
  Subjective:     Patient ID: Jacob Lee, male   DOB: Dec 05, 1960, 57 y.o.   MRN: [id number] Chief Complaint  Patient presents with  . Rash    pt reports he gets a white rash in left ear that itches and is flaky.  pt reports it has been going on for a month now since September 2019   HPI States he has lost 30 lb with diet and exercise. Reports he has been off lisinopril for 3 weeks until he took a dose this AM. He is married and reports poor erections. He tried Viagra from a friend and found it to work well. He is pending colonoscopy per Dr.Elliott but defers flu shot.  Review of Systems     Objective:   Physical Exam  Constitutional: He appears well-developed and well-nourished. No distress.  HENT:  Right ear canal patent with normal TM Left ear canal patent with mild scaling noted at external ear. TM is normal.  Cardiovascular: Normal rate and regular rhythm.  Pulmonary/Chest: Breath sounds normal.  Musculoskeletal: He exhibits no edema (of distal lower extremities).       Assessment:    1. Eczema of external ear, left - mometasone (ELOCON) 0.1 % lotion; Apply topically daily. To left external ear. May stop when symptoms resolve.  Dispense: 60 mL; Refill: 1  2. Essential hypertension - Comprehensive metabolic panel  3. Screening for cholesterol level - Lipid panel  4. Screening for prostate cancer - PSA    Plan:    Further f/u pending labs. Will start Viagra if labs ok. Stop bp medication and come in for a nurse bp check in 2-3 weeks.

## 2018-04-05 NOTE — Patient Instructions (Addendum)
Stop bp medication and come in for a nurse bp check in 2-3 weeks. If your labs are ok we can start Viagra. We will call you with the results tomorrow.

## 2018-04-06 ENCOUNTER — Telehealth: Payer: Self-pay

## 2018-04-06 ENCOUNTER — Other Ambulatory Visit: Payer: Self-pay | Admitting: Family Medicine

## 2018-04-06 DIAGNOSIS — N529 Male erectile dysfunction, unspecified: Secondary | ICD-10-CM

## 2018-04-06 LAB — COMPREHENSIVE METABOLIC PANEL
ALBUMIN: 4.5 g/dL (ref 3.5–5.5)
ALT: 31 IU/L (ref 0–44)
AST: 32 IU/L (ref 0–40)
Albumin/Globulin Ratio: 1.7 (ref 1.2–2.2)
Alkaline Phosphatase: 49 IU/L (ref 39–117)
BILIRUBIN TOTAL: 0.6 mg/dL (ref 0.0–1.2)
BUN / CREAT RATIO: 11 (ref 9–20)
BUN: 11 mg/dL (ref 6–24)
CO2: 22 mmol/L (ref 20–29)
Calcium: 9.7 mg/dL (ref 8.7–10.2)
Chloride: 100 mmol/L (ref 96–106)
Creatinine, Ser: 1.02 mg/dL (ref 0.76–1.27)
GFR calc non Af Amer: 82 mL/min/{1.73_m2} (ref >59)
GFR, EST AFRICAN AMERICAN: 95 mL/min/{1.73_m2} (ref >59)
GLUCOSE: 95 mg/dL (ref 65–99)
Globulin, Total: 2.6 g/dL (ref 1.5–4.5)
Potassium: 4.4 mmol/L (ref 3.5–5.2)
Sodium: 141 mmol/L (ref 134–144)
TOTAL PROTEIN: 7.1 g/dL (ref 6.0–8.5)

## 2018-04-06 LAB — LIPID PANEL
CHOLESTEROL TOTAL: 137 mg/dL (ref 100–199)
Chol/HDL Ratio: 3.2 ratio (ref 0.0–5.0)
HDL: 43 mg/dL (ref >39)
LDL Calculated: 71 mg/dL (ref 0–99)
Triglycerides: 115 mg/dL (ref 0–149)
VLDL CHOLESTEROL CAL: 23 mg/dL (ref 5–40)

## 2018-04-06 LAB — PSA: PROSTATE SPECIFIC AG, SERUM: 0.6 ng/mL (ref 0.0–4.0)

## 2018-04-06 MED ORDER — SILDENAFIL CITRATE 100 MG PO TABS: 100.0000 mg | ORAL_TABLET | Freq: Every day | ORAL | 5 refills | Status: DC | PRN

## 2018-04-06 NOTE — Telephone Encounter (Signed)
Patient returned call and was advised.,PC

## 2018-04-06 NOTE — Telephone Encounter (Signed)
-----   Message from Carmon Ginsberg, [place] sent at 04/06/2018  7:57 AM EDT ----- Labs are excellent. Let see how your blood pressure is doing off medication in 2-3 weeks.

## 2018-04-06 NOTE — Telephone Encounter (Signed)
Patient was advised and wanted to know if you was going to still send in the Viagra medication for him.

## 2018-04-06 NOTE — Telephone Encounter (Signed)
LVMTRC.,PC 

## 2018-04-06 NOTE — Telephone Encounter (Signed)
Let him know I sent in the 100 mg generic Viagra

## 2018-04-07 ENCOUNTER — Telehealth: Payer: Self-pay

## 2018-04-07 NOTE — Telephone Encounter (Signed)
Patient had called office stating that he would like to speak with Evlyn Clines, I see a telephone encounter from yesterday 04/07/18, but it states that patient was advised of results. Please review chart and advise. KW

## 2018-04-07 NOTE — Telephone Encounter (Signed)
Patient wants to know if you could change the sildenafil to the name brand medication Viagra 100mg  due to the genetic brand giving him a severe headache.

## 2018-04-08 ENCOUNTER — Other Ambulatory Visit: Payer: Self-pay | Admitting: Family Medicine

## 2018-04-08 DIAGNOSIS — N529 Male erectile dysfunction, unspecified: Secondary | ICD-10-CM

## 2018-04-08 MED ORDER — SILDENAFIL CITRATE 100 MG PO TABS: 100.0000 mg | ORAL_TABLET | Freq: Every day | ORAL | 5 refills | Status: DC | PRN

## 2018-04-08 NOTE — Telephone Encounter (Signed)
I have requested brand name. Headache is a leading side effect of the medication generic or not.

## 2018-04-08 NOTE — Telephone Encounter (Signed)
Patient advised as directed below. 

## 2018-06-27 ENCOUNTER — Other Ambulatory Visit: Payer: Self-pay | Admitting: Family Medicine

## 2018-06-27 DIAGNOSIS — I10 Essential (primary) hypertension: Secondary | ICD-10-CM

## 2018-10-20 DIAGNOSIS — Z86010 Personal history of colonic polyps: Secondary | ICD-10-CM | POA: Insufficient documentation

## 2018-12-31 DIAGNOSIS — Z01812 Encounter for preprocedural laboratory examination: Secondary | ICD-10-CM | POA: Diagnosis not present

## 2019-01-06 DIAGNOSIS — K635 Polyp of colon: Secondary | ICD-10-CM | POA: Diagnosis not present

## 2019-01-06 DIAGNOSIS — Z86010 Personal history of colonic polyps: Secondary | ICD-10-CM | POA: Diagnosis not present

## 2019-01-06 DIAGNOSIS — D120 Benign neoplasm of cecum: Secondary | ICD-10-CM | POA: Diagnosis not present

## 2019-03-28 DIAGNOSIS — Z20828 Contact with and (suspected) exposure to other viral communicable diseases: Secondary | ICD-10-CM | POA: Diagnosis not present

## 2019-03-28 DIAGNOSIS — R05 Cough: Secondary | ICD-10-CM | POA: Diagnosis not present

## 2019-03-28 DIAGNOSIS — J069 Acute upper respiratory infection, unspecified: Secondary | ICD-10-CM | POA: Diagnosis not present

## 2019-04-04 ENCOUNTER — Other Ambulatory Visit: Payer: Self-pay

## 2019-04-04 DIAGNOSIS — Z20822 Contact with and (suspected) exposure to covid-19: Secondary | ICD-10-CM

## 2019-04-06 LAB — NOVEL CORONAVIRUS, NAA: SARS-CoV-2, NAA: DETECTED — AB

## 2019-05-09 ENCOUNTER — Telehealth: Payer: Self-pay | Admitting: Adult Health

## 2019-05-09 NOTE — Telephone Encounter (Signed)
I wasn't sure if it was okay to authorize refill since you have not met this patient yet, please advise. KW 

## 2019-05-09 NOTE — Telephone Encounter (Signed)
[facility] faxed refill request for the following medications:  sildenafil (VIAGRA) 100 MG tablet  Last Rx: 04/08/2018 LOV: 04/05/2018 Pt was seeing Mikki Santee and hasn't been seen since his OV with Mikki Santee on 04/05/2018. Pt isn't scheduled for a follow up visit with anyone in our office. Please advise. Thanks TNP

## 2019-05-09 NOTE — Telephone Encounter (Signed)
Thanks no I am not comfortable refilling as does not look like he has been in the office a while. He should make an office visit- virtual if just for that should be ok if he can provide some vitals if not an in office visit.  Thanks

## 2019-05-09 NOTE — Telephone Encounter (Signed)
Spoke with pt ans he is scheduled for MyChart video visit with Sharyn Lull on 05/12/2019 @ 3:40 pm. Thanks TNP

## 2019-05-09 NOTE — Telephone Encounter (Signed)
Patient would need an office visit to discuss/ follow up on chronic issues. Should schedule.

## 2019-05-11 ENCOUNTER — Other Ambulatory Visit: Payer: Self-pay | Admitting: Adult Health

## 2019-05-12 ENCOUNTER — Other Ambulatory Visit: Payer: Self-pay

## 2019-05-12 ENCOUNTER — Telehealth (INDEPENDENT_AMBULATORY_CARE_PROVIDER_SITE_OTHER): Payer: BC Managed Care – PPO | Admitting: Adult Health

## 2019-05-12 ENCOUNTER — Encounter: Payer: Self-pay | Admitting: Adult Health

## 2019-05-12 DIAGNOSIS — N529 Male erectile dysfunction, unspecified: Secondary | ICD-10-CM | POA: Diagnosis not present

## 2019-05-12 MED ORDER — SILDENAFIL CITRATE 100 MG PO TABS: 100.0000 mg | ORAL_TABLET | Freq: Every day | ORAL | 0 refills | Status: DC | PRN

## 2019-05-12 NOTE — Progress Notes (Signed)
Patient: Jacob Lee Male    DOB: 12/10/1960   57 y.o.   MRN: [id number] Visit Date: 05/12/2019  Today's Provider: Marcille Buffy, FNP   Chief Complaint  Patient presents with  . Erectile Dysfunction    Follow Up   Subjective:    Virtual Visit via Video Note  I connected with Jacob Lee on 05/12/19 at  3:40 PM EST by a video enabled telemedicine application and verified that I am speaking with the correct person using two identifiers.  Location: Patient: at home  Provider: Provider: Provider's office at  [facility], [place].      I discussed the limitations of evaluation and management by telemedicine and the availability of in person appointments. The patient expressed understanding and agreed to proceed.    I discussed the assessment and treatment plan with the patient. The patient was provided an opportunity to ask questions and all were answered. The patient agreed with the plan and demonstrated an understanding of the instructions.   The patient was advised to call back or seek an in-person evaluation if the symptoms worsen or if the condition fails to improve as anticipated.  I provided 15 minutes of non-face-to-face time during this encounter.   Erectile Dysfunction This is a recurrent problem. The current episode started more than 1 year ago. The problem is unchanged. The treatment provided significant relief. He has been using treatment for 1 to 2 years. He has had no adverse reactions caused by medications. Risk factors include hypertension.  Patient is seen today for refill on his Viagra. Reports he has been taking it for many years, without any more need side effects. Denies any chest pain. He breaks his Viagra 100 mg into three pieces and he uses only one piece a day when needed.  He reports this is how his previous provider Carmon Ginsberg, PA fasting to use the medication.  He has not had any problems with the  medication.  He denies chest pain or shortness of breath. Denies any new medical history since he was last seen in the office.  He denies using any nitrates.   He is not on any antihypertensive medications.  He does not have any cardiac history reported to me.   Denies any penile pain, prolonged erections that will not resolve, or any abnormal discharge. Had Covid 04/04/2019 he  reports was mild and he is completely over it. No residual shortness of breath, no chest pain.  Denies any dizziness lightheadedness or syncopal episodes. Patient  denies any fever, body aches,chills, rash, chest pain, shortness of breath, nausea, vomiting, or diarrhea.    Allergies  Allergen Reactions  . Cefdinir     sleep disturbance  . Codeine Nausea Only  . Sudafed  [Pseudoephedrine Hcl] Rash     Current Outpatient Medications:  .  aspirin 81 MG tablet, Take 1 tablet by mouth daily., Disp: , Rfl:  .  benzonatate (TESSALON) 200 MG capsule, Take 200 mg by mouth 3 (three) times daily as needed., Disp: , Rfl:  .  doxycycline (VIBRA-TABS) 100 MG tablet, Take 100 mg by mouth 2 (two) times daily., Disp: , Rfl:  .  ibuprofen (ADVIL,MOTRIN) 800 MG tablet, Take 1 tablet (800 mg total) by mouth every 8 (eight) hours as needed. (Patient not taking: Reported on 04/05/2018), Disp: 30 tablet, Rfl: 0 .  lisinopril (PRINIVIL,ZESTRIL) 10 MG tablet, TAKE 1 TABLET BY MOUTH EVERY DAY, Disp: 90 tablet, Rfl: 0 .  mometasone (ELOCON) 0.1 % lotion, Apply topically daily. To left external ear. May stop when symptoms resolve., Disp: 60 mL, Rfl: 1 .  predniSONE (STERAPRED UNI-PAK 21 TAB) 10 MG (21) TBPK tablet, TAKE 6 TABLETS ON DAY 1 AS DIRECTED ON PACKAGE AND DECREASE BY 1 TAB EACH DAY FOR A TOTAL OF 6 DAYS, Disp: , Rfl:  .  sildenafil (VIAGRA) 100 MG tablet, Take 1 tablet (100 mg total) by mouth daily as needed for erectile dysfunction., Disp: 10 tablet, Rfl: 5  Review of Systems  Social History   Tobacco Use  . Smoking status:  Never Smoker  . Smokeless tobacco: Never Used  Substance Use Topics  . Alcohol use: No    Alcohol/week: 0.0 standard drinks      Objective:  Does not have any vital signs to report during the visit, he has no way of taking.  He does report that he is taking his blood pressure has been within normal range in the last month.  Physical Exam   Patient is alert and oriented and responsive to questions Engages in conversation with provider. Speaks in full sentences without any pauses without any shortness of breath or distress.   No results found for any visits on 05/12/19.     Assessment & Plan    1. Erectile dysfunction, unspecified erectile dysfunction type Patient advised to use as previously directed by his previous provider.  He reports he is breaking the tablets up to 3 even pieces and only taken 1 piece of the tablet daily. He reports he does not use everyday.   Discussed side effects, and reporting to any healthcare worker should he go to the emergency room that he is on Viagra.  Discussed side effects with nitrates, and not to take if taking any nitrates.  Patient denied any nitrate use. Discussed possible side effects and when to seek emergency medical care.  - sildenafil (VIAGRA) 100 MG tablet; Take 1 tablet (100 mg total) by mouth daily as needed for erectile dysfunction (use as directed by provider).  Dispense: 10 tablet; Refill: 0  He is also advised that he should call the office and schedule an appointment to have care established with another provider, since his previous provider has left the clinic.  Patient is in agreement and will call the office, he is hesitant to come in due to Covid pandemic at this time but will schedule this in the near future.  Please schedule a follow up appointment in 1 month.    The entirety of the information documented in the History of Present Illness, Review of Systems and Physical Exam were personally obtained by me. Portions of this  information were initially documented by the  Certified Medical Assistant whose name is documented in [place] and reviewed by me for thoroughness and accuracy.  I have personally performed the exam and reviewed the chart and it is accurate to the best of my knowledge.  Haematologist has been used and any errors in dictation or transcription are unintentional.  Kelby Aline. [place], [facility]

## 2019-05-12 NOTE — Patient Instructions (Signed)
Erectile Dysfunction Erectile dysfunction (ED) is the inability to get or keep an erection in order to have sexual intercourse. Erectile dysfunction may include:  Inability to get an erection.  Lack of enough hardness of the erection to allow penetration.  Loss of the erection before sex is finished. What are the causes? This condition may be caused by:  Certain medicines, such as: ? Pain relievers. ? Antihistamines. ? Antidepressants. ? Blood pressure medicines. ? Water pills (diuretics). ? Ulcer medicines. ? Muscle relaxants. ? Drugs.  Excessive drinking.  Psychological causes, such as: ? Anxiety. ? Depression. ? Sadness. ? Exhaustion. ? Performance fear. ? Stress.  Physical causes, such as: ? Artery problems. This may include diabetes, smoking, liver disease, or atherosclerosis. ? High blood pressure. ? Hormonal problems, such as low testosterone. ? Obesity. ? Nerve problems. This may include back or pelvic injuries, diabetes mellitus, multiple sclerosis, or Parkinson disease. What are the signs or symptoms? Symptoms of this condition include:  Inability to get an erection.  Lack of enough hardness of the erection to allow penetration.  Loss of the erection before sex is finished.  Normal erections at some times, but with frequent unsatisfactory episodes.  Low sexual satisfaction in either partner due to erection problems.  A curved penis occurring with erection. The curve may cause pain or the penis may be too curved to allow for intercourse.  Never having nighttime erections. How is this diagnosed? This condition is often diagnosed by:  Performing a physical exam to find other diseases or specific problems with the penis.  Asking you detailed questions about the problem.  Performing blood tests to check for diabetes mellitus or to measure hormone levels.  Performing other tests to check for underlying health conditions.  Performing an ultrasound  exam to check for scarring.  Performing a test to check blood flow to the penis.  Doing a sleep study at home to measure nighttime erections. How is this treated? This condition may be treated by:  Medicine taken by mouth to help you achieve an erection (oral medicine).  Hormone replacement therapy to replace low testosterone levels.  Medicine that is injected into the penis. Your health care provider may instruct you how to give yourself these injections at home.  Vacuum pump. This is a pump with a ring on it. The pump and ring are placed on the penis and used to create pressure that helps the penis become erect.  Penile implant surgery. In this procedure, you may receive: ? An inflatable implant. This consists of cylinders, a pump, and a reservoir. The cylinders can be inflated with a fluid that helps to create an erection, and they can be deflated after intercourse. ? A semi-rigid implant. This consists of two silicone rubber rods. The rods provide some rigidity. They are also flexible, so the penis can both curve downward in its normal position and become straight for sexual intercourse.  Blood vessel surgery, to improve blood flow to the penis. During this procedure, a blood vessel from a different part of the body is placed into the penis to allow blood to flow around (bypass) damaged or blocked blood vessels.  Lifestyle changes, such as exercising more, losing weight, and quitting smoking. Follow these instructions at home: Medicines   Take over-the-counter and prescription medicines only as told by your health care provider. Do not increase the dosage without first discussing it with your health care provider.  If you are using self-injections, perform injections as directed by your   health care provider. Make sure to avoid any veins that are on the surface of the penis. After giving an injection, apply pressure to the injection site for 5 minutes. General instructions   Exercise regularly, as directed by your health care provider. Work with your health care provider to lose weight, if needed.  Do not use any products that contain nicotine or tobacco, such as cigarettes and e-cigarettes. If you need help quitting, ask your health care provider.  Before using a vacuum pump, read the instructions that come with the pump and discuss any questions with your health care provider.  Keep all follow-up visits as told by your health care provider. This is important. Contact a health care provider if:  You feel nauseous.  You vomit. Get help right away if:  You are taking oral or injectable medicines and you have an erection that lasts longer than 4 hours. If your health care provider is unavailable, go to the nearest emergency room for evaluation. An erection that lasts much longer than 4 hours can result in permanent damage to your penis.  You have severe pain in your groin or abdomen.  You develop redness or severe swelling of your penis.  You have redness spreading up into your groin or lower abdomen.  You are unable to urinate.  You experience chest pain or a rapid heart beat (palpitations) after taking oral medicines. Summary  Erectile dysfunction (ED) is the inability to get or keep an erection during sexual intercourse. This problem can usually be treated successfully.  This condition is diagnosed based on a physical exam, your symptoms, and tests to determine the cause. Treatment varies depending on the cause, and may include medicines, hormone therapy, surgery, or vacuum pump.  You may need follow-up visits to make sure that you are using your medicines or devices correctly.  Get help right away if you are taking or injecting medicines and you have an erection that lasts longer than 4 hours. This information is not intended to replace advice given to you by your health care provider. Make sure you discuss any questions you have with your health care  provider. Document Released: 05/23/2000 Document Revised: 05/08/2017 Document Reviewed: 06/11/2016 Elsevier Patient Education  [place]. Sildenafil tablets (Erectile Dysfunction) What is this medicine? SILDENAFIL (sil DEN a fil) is used to treat erection problems in men. This medicine may be used for other purposes; ask your health care provider or pharmacist if you have questions. COMMON BRAND NAME(S): Viagra What should I tell my health care provider before I take this medicine? They need to know if you have any of these conditions:  bleeding disorders  eye or vision problems, including a rare inherited eye disease called retinitis pigmentosa  anatomical deformation of the penis, Peyronie's disease, or history of priapism (painful and prolonged erection)  heart disease, angina, a history of heart attack, irregular heart beats, or other heart problems  high or low blood pressure  history of blood diseases, like sickle cell anemia or leukemia  history of stomach bleeding  kidney disease  liver disease  stroke  an unusual or allergic reaction to sildenafil, other medicines, foods, dyes, or preservatives  pregnant or trying to get pregnant  breast-feeding How should I use this medicine? Take this medicine by mouth with a glass of water. Follow the directions on the prescription label. The dose is usually taken 1 hour before sexual activity. You should not take the dose more than once per day. Do  not take your medicine more often than directed. Talk to your pediatrician regarding the use of this medicine in children. This medicine is not used in children for this condition. Overdosage: If you think you have taken too much of this medicine contact a poison control center or emergency room at once. NOTE: This medicine is only for you. Do not share this medicine with others. What if I miss a dose? This does not apply. Do not take double or extra doses. What may interact  with this medicine? Do not take this medicine with any of the following medications:  cisapride  nitrates like amyl nitrite, isosorbide dinitrate, isosorbide mononitrate, nitroglycerin  riociguat This medicine may also interact with the following medications:  antiviral medicines for HIV or AIDS  bosentan  certain medicines for benign prostatic hyperplasia (BPH)  certain medicines for blood pressure  certain medicines for fungal infections like ketoconazole and itraconazole  cimetidine  erythromycin  rifampin This list may not describe all possible interactions. Give your health care provider a list of all the medicines, herbs, non-prescription drugs, or dietary supplements you use. Also tell them if you smoke, drink alcohol, or use illegal drugs. Some items may interact with your medicine. What should I watch for while using this medicine? If you notice any changes in your vision while taking this drug, call your doctor or health care professional as soon as possible. Stop using this medicine and call your health care provider right away if you have a loss of sight in one or both eyes. Contact your doctor or health care professional right away if you have an erection that lasts longer than 4 hours or if it becomes painful. This may be a sign of a serious problem and must be treated right away to prevent permanent damage. If you experience symptoms of nausea, dizziness, chest pain or arm pain upon initiation of sexual activity after taking this medicine, you should refrain from further activity and call your doctor or health care professional as soon as possible. Do not drink alcohol to excess (examples, 5 glasses of wine or 5 shots of whiskey) when taking this medicine. When taken in excess, alcohol can increase your chances of getting a headache or getting dizzy, increasing your heart rate or lowering your blood pressure. Using this medicine does not protect you or your partner  against HIV infection (the virus that causes AIDS) or other sexually transmitted diseases. What side effects may I notice from receiving this medicine? Side effects that you should report to your doctor or health care professional as soon as possible:  allergic reactions like skin rash, itching or hives, swelling of the face, lips, or tongue  breathing problems  changes in hearing  changes in vision  chest pain  fast, irregular heartbeat  prolonged or painful erection  seizures Side effects that usually do not require medical attention (report to your doctor or health care professional if they continue or are bothersome):  back pain  dizziness  flushing  headache  indigestion  muscle aches  nausea  stuffy or runny nose This list may not describe all possible side effects. Call your doctor for medical advice about side effects. You may report side effects to FDA at 1-800-FDA-1088. Where should I keep my medicine? Keep out of reach of children. Store at room temperature between 15 and 30 degrees C (59 and 86 degrees F). Throw away any unused medicine after the expiration date. NOTE: This sheet is a summary. It may  not cover all possible information. If you have questions about this medicine, talk to your doctor, pharmacist, or health care provider.  2020 Elsevier/Gold Standard (2015-05-09 12:00:25)

## 2019-09-28 ENCOUNTER — Other Ambulatory Visit: Payer: Self-pay

## 2019-09-28 ENCOUNTER — Ambulatory Visit: Payer: BC Managed Care – PPO | Admitting: Dermatology

## 2019-09-28 ENCOUNTER — Encounter: Payer: Self-pay | Admitting: Dermatology

## 2019-09-28 DIAGNOSIS — L578 Other skin changes due to chronic exposure to nonionizing radiation: Secondary | ICD-10-CM

## 2019-09-28 DIAGNOSIS — L719 Rosacea, unspecified: Secondary | ICD-10-CM

## 2019-09-28 DIAGNOSIS — L817 Pigmented purpuric dermatosis: Secondary | ICD-10-CM | POA: Diagnosis not present

## 2019-09-28 DIAGNOSIS — D229 Melanocytic nevi, unspecified: Secondary | ICD-10-CM

## 2019-09-28 DIAGNOSIS — Z1283 Encounter for screening for malignant neoplasm of skin: Secondary | ICD-10-CM | POA: Diagnosis not present

## 2019-09-28 DIAGNOSIS — L814 Other melanin hyperpigmentation: Secondary | ICD-10-CM

## 2019-09-28 NOTE — Progress Notes (Signed)
   New Patient Visit  Subjective  Jacob Lee is a 60 y.o. male who presents for the following: Annual Exam (New pt no concerns, no hx of fhx of skin ca). The patient presents for skin cancer screening and mole check and total body skin exam.  The following portions of the chart were reviewed this encounter and updated as appropriate:  Tobacco  Allergies  Meds  Problems  Med Hx  Surg Hx  Fam Hx      Review of Systems:  No other skin or systemic complaints except as noted in HPI or Assessment and Plan.  Objective  Well appearing patient in no apparent distress; mood and affect are within normal limits.  A full examination was performed including scalp, head, eyes, ears, nose, lips, neck, chest, axillae, abdomen, back, buttocks, bilateral upper extremities, bilateral lower extremities, hands, feet, fingers, toes, fingernails, and toenails. All findings within normal limits unless otherwise noted below.  Objective  face: Dilated vessels cheeks, nose, redness of eyes  Objective  bil lower legs: Stasis changes lower legs   Assessment & Plan   Melanocytic Nevi - Tan-brown and/or pink-flesh-colored symmetric macules and papules - Benign appearing on exam today - Observation - Call clinic for new or changing moles - Recommend daily use of broad spectrum spf 30+ sunscreen to sun-exposed areas.   Lentigines - Scattered tan macules - Discussed due to sun exposure - Benign, observe - Call for any changes  Actinic Damage - diffuse scaly erythematous macules with underlying dyspigmentation - Recommend daily broad spectrum sunscreen SPF 30+ to sun-exposed areas, reapply every 2 hours as needed.  - Call for new or changing lesions.   Rosacea face  With Ocular Rosacea Discussed BBL txt $400 per txt session, several txts for best results. Advised pt to discuss Ocular rosacea with opthamologist  Schamberg's purpura bil lower legs  Benign, observe.  Skin cancer  screening    Return in about 1 year (around 09/27/2020) for TBSE.     Documentation: I have reviewed the above documentation for accuracy and completeness, and I agree with the above.  Sarina Ser, MD   I, Othelia Pulling, RMA, am acting as scribe for Sarina Ser, MD .  Documentation: I have reviewed the above documentation for accuracy and completeness, and I agree with the above.  Sarina Ser, MD

## 2019-09-29 ENCOUNTER — Encounter: Payer: Self-pay | Admitting: Dermatology

## 2019-10-05 ENCOUNTER — Other Ambulatory Visit: Payer: Self-pay | Admitting: Adult Health

## 2019-10-05 DIAGNOSIS — N529 Male erectile dysfunction, unspecified: Secondary | ICD-10-CM

## 2019-10-05 MED ORDER — SILDENAFIL CITRATE 100 MG PO TABS: 100.0000 mg | ORAL_TABLET | Freq: Every day | ORAL | 0 refills | Status: DC | PRN

## 2019-10-05 NOTE — Telephone Encounter (Signed)
Medication Refill - Medication: sildenafil   Has the patient contacted their pharmacy? Yes.   (Agent: If no, request that the patient contact the pharmacy for the refill.) (Agent: If yes, when and what did the pharmacy advise?)  Preferred Pharmacy (with phone number or street name):  [id number] - [place], [place] [address]  [address] [place]  [address]  Phone: [phone] Fax: [phone]  Not a 24 hour pharmacy; exact hours not known.     Agent: Please be advised that RX refills may take up to 3 business days. We ask that you follow-up with your pharmacy.

## 2019-10-05 NOTE — Telephone Encounter (Signed)
Requested Prescriptions  Pending Prescriptions Disp Refills  . sildenafil (VIAGRA) 100 MG tablet 10 tablet 0    Sig: Take 1 tablet (100 mg total) by mouth daily as needed for erectile dysfunction (use as directed by provider).     Urology: Erectile Dysfunction Agents Passed - 10/05/2019 11:46 AM      Passed - Last BP in normal range    BP Readings from Last 1 Encounters:  04/05/18 118/88         Passed - Valid encounter within last 12 months    Recent Outpatient Visits          4 months ago Erectile dysfunction, unspecified erectile dysfunction type   [place], FNP   1 year ago Eczema of external ear, left   [place], [place]   2 years ago Epidermoid cyst of skin   [place], [place]   3 years ago Lumbar transverse process fracture, with routine healing, subsequent encounter   [place], [place]   3 years ago Lumbar transverse process fracture, with routine healing, subsequent encounter   [place], [place], [place]      Future Appointments            In 11 months Ralene Bathe, MD [place]

## 2019-12-02 ENCOUNTER — Other Ambulatory Visit: Payer: Self-pay | Admitting: Adult Health

## 2019-12-02 DIAGNOSIS — N529 Male erectile dysfunction, unspecified: Secondary | ICD-10-CM

## 2019-12-28 DIAGNOSIS — Z03818 Encounter for observation for suspected exposure to other biological agents ruled out: Secondary | ICD-10-CM | POA: Diagnosis not present

## 2019-12-28 DIAGNOSIS — Z20822 Contact with and (suspected) exposure to covid-19: Secondary | ICD-10-CM | POA: Diagnosis not present

## 2019-12-30 DIAGNOSIS — Z20822 Contact with and (suspected) exposure to covid-19: Secondary | ICD-10-CM | POA: Diagnosis not present

## 2019-12-30 DIAGNOSIS — Z03818 Encounter for observation for suspected exposure to other biological agents ruled out: Secondary | ICD-10-CM | POA: Diagnosis not present

## 2020-04-02 ENCOUNTER — Telehealth: Payer: Self-pay | Admitting: Adult Health

## 2020-04-02 ENCOUNTER — Other Ambulatory Visit: Payer: Self-pay | Admitting: Adult Health

## 2020-04-02 DIAGNOSIS — N529 Male erectile dysfunction, unspecified: Secondary | ICD-10-CM

## 2020-04-02 DIAGNOSIS — H1045 Other chronic allergic conjunctivitis: Secondary | ICD-10-CM | POA: Diagnosis not present

## 2020-04-02 MED ORDER — SILDENAFIL CITRATE 25 MG PO TABS: 25.0000 mg | ORAL_TABLET | Freq: Every day | ORAL | 0 refills | Status: DC | PRN

## 2020-04-02 NOTE — Telephone Encounter (Signed)
[facility] fax[facility] refill request for the following medications:   sildenafil (VIAGRA) 100 MG tablet   Please advise. Thanks, [employer]

## 2020-04-02 NOTE — Telephone Encounter (Signed)
Refilled as Viagra 25mg  PRN once daily # 10 tablets  since note says patient was breaking the 100 mg tablet into three pieces. This will  be safest.  I do advise to schedule in office follow up as patient to switch over to me or another provider since he was Jacob Lee patient and has only seen me once by video.   Needs CPE when due.   Do not take with any cardiac problems, also seek care id prolonged erection immediately is advised at anytime.

## 2020-04-02 NOTE — Telephone Encounter (Signed)
Last visit was 05/12/2019, please advise if appropriate to refill or office visit is needed? KW

## 2020-04-05 NOTE — Telephone Encounter (Signed)
[facility]:  sildenafil (VIAGRA) 25 MG tablet  Thanks, [place]

## 2020-04-10 NOTE — Telephone Encounter (Signed)
Prescription was filled 8 days ago. KW

## 2020-07-02 ENCOUNTER — Telehealth: Payer: Self-pay | Admitting: Adult Health

## 2020-07-02 NOTE — Telephone Encounter (Signed)
[facility] ref[facility] request for the following medications:  sildenafil (VIAGRA) 25 MG tablet  Last Rx: 04/02/2020 LOV: 09/28/2019 Please advise. Thanks TNP

## 2020-07-02 NOTE — Telephone Encounter (Signed)
Called and spoke with patient and informed him that office visit would be needed appointment has been scheduled for 07/04/20 at 10:20AM. KW

## 2020-07-02 NOTE — Telephone Encounter (Signed)
Please have him schedule a follow up office visit before any refills.   Thank you,  Laverna Peace MSN, AGNP-C, FNP-C  Family Nurse Practitioner  Adult Geriatric Nurse Practitioner

## 2020-07-04 ENCOUNTER — Telehealth (INDEPENDENT_AMBULATORY_CARE_PROVIDER_SITE_OTHER): Payer: BC Managed Care – PPO | Admitting: Adult Health

## 2020-07-04 ENCOUNTER — Encounter: Payer: Self-pay | Admitting: Adult Health

## 2020-07-04 DIAGNOSIS — N529 Male erectile dysfunction, unspecified: Secondary | ICD-10-CM

## 2020-07-04 MED ORDER — SILDENAFIL CITRATE 50 MG PO TABS: 50.0000 mg | ORAL_TABLET | Freq: Every day | ORAL | 0 refills | Status: DC | PRN

## 2020-07-04 NOTE — Progress Notes (Signed)
MyChart Video Visit    Virtual Visit via Video Note   This visit type was conducted due to national recommendations for restrictions regarding the COVID-19 Pandemic (e.g. social distancing) in an effort to limit this patient's exposure and mitigate transmission in our community. This patient is at least at moderate risk for complications without adequate follow up. This format is felt to be most appropriate for this patient at this time. Physical exam was limited by quality of the video and audio technology used for the visit.  Parties involved in visit as below:   Patient location: at home  Provider location: Provider: Provider's office at  [facility], [place].     I discussed the limitations of evaluation and management by telemedicine and the availability of in person appointments. The patient expressed understanding and agreed to proceed.  Patient: Jacob Lee   DOB: 05/05/1961   60 y.o. Male  MRN: [id number] Visit Date: 07/04/2020  Today's healthcare provider: Marcille Buffy, FNP   Chief Complaint  Patient presents with  . Erectile Dysfunction   Subjective    HPI  Follow up for Erectile Dysfunction  The patient was last seen for this 13 months ago. Changes made at last visit include none, patient to continue Sildenafil 25 mg tablet as needed for erectile dysfunction.  He feels as if the 25 mg tablet does not help since with an erection but does not help as well as it was when he was taking the 50 mg Viagra.  He would like to go back to the 50 mg and is aware to only take 1/day. He denies any changes in his health since he was last seen, he has been advised to come into the office for a in person visit as soon as possible but declines due to COVID-19 pandemic at this time.  He is aware he is overdue for labs and there is lab orders are in and he can come to the lab and walk-in for those at his convenience.  He denies any rectal pressure  rectal pain or urinary symptoms. He denies any chest pain or any adverse side effects to the Viagra.  He reports he feels well and is doing well and that he will schedule an appointment to come into the office in the near future.  He reports excellent compliance with treatment. He feels that condition is Unchanged. He is not having side effects.    Patient  denies any fever, body aches,chills, rash, chest pain, shortness of breath, nausea, vomiting, or diarrhea.  Denies dizziness, lightheadedness, pre syncopal or syncopal episodes.   Denies any recent illness. He has no other concerns for today's visit.   -----------------------------------------------------------------------------------------   Patient Active Problem List   Diagnosis Date Noted  . Hx of adenomatous colonic polyps 10/20/2018  . NASH (nonalcoholic steatohepatitis) 10/24/2016  . Coronary atherosclerosis 12/03/2015  . Family history of hypothyroidism 12/03/2015  . H/O Bell's palsy 12/03/2015  . Herpes simplex 12/03/2015  . BP (high blood pressure) 12/03/2015   Past Medical History:  Diagnosis Date  . Hypertension    Allergies  Allergen Reactions  . Cefdinir     sleep disturbance  . Codeine Nausea Only  . Sudafed  [Pseudoephedrine Hcl] Rash      Medications: Outpatient Medications Prior to Visit  Medication Sig  . aspirin 81 MG tablet Take 1 tablet by mouth daily.  . benzonatate (TESSALON) 200 MG capsule Take 200 mg by mouth 3 (three) times  daily as needed.  . doxycycline (VIBRA-TABS) 100 MG tablet Take 100 mg by mouth 2 (two) times daily.  Marland Kitchen esomeprazole (NEXIUM) 20 MG capsule Take by mouth.  Marland Kitchen ibuprofen (ADVIL,MOTRIN) 800 MG tablet Take 1 tablet (800 mg total) by mouth every 8 (eight) hours as needed. (Patient not taking: Reported on 04/05/2018)  . lisinopril (PRINIVIL,ZESTRIL) 10 MG tablet TAKE 1 TABLET BY MOUTH EVERY DAY (Patient not taking: Reported on 09/28/2019)  . mometasone (ELOCON) 0.1 % lotion  Apply topically daily. To left external ear. May stop when symptoms resolve. (Patient not taking: Reported on 09/28/2019)  . predniSONE (STERAPRED UNI-PAK 21 TAB) 10 MG (21) TBPK tablet TAKE 6 TABLETS ON DAY 1 AS DIRECTED ON PACKAGE AND DECREASE BY 1 TAB EACH DAY FOR A TOTAL OF 6 DAYS  . [DISCONTINUED] sildenafil (VIAGRA) 25 MG tablet Take 1 tablet (25 mg total) by mouth daily as needed for erectile dysfunction.   No facility-administered medications prior to visit.    Review of Systems  Constitutional: Negative.   HENT: Negative.   Respiratory: Negative.   Cardiovascular: Negative.   Gastrointestinal: Negative.   Endocrine: Negative.   Genitourinary: Negative.        Erectile dysfunction.   Musculoskeletal: Negative.   Neurological: Negative.   Hematological: Negative.   Psychiatric/Behavioral: Negative.       Objective    There were no vitals taken for this visit. BP Readings from Last 3 Encounters:  04/05/18 118/88  10/09/16 124/80  01/18/16 122/84   Wt Readings from Last 3 Encounters:  04/05/18 192 lb 9.6 oz (87.4 kg)  10/09/16 219 lb 3.2 oz (99.4 kg)  01/18/16 210 lb 9.6 oz (95.5 kg)      Physical Exam   Patient is alert and oriented and responsive to questions Engages in conversation with provider. Speaks in full sentences without any pauses without any shortness of breath or distress.    Assessment & Plan     Erectile dysfunction, unspecified erectile dysfunction type - Plan: CBC with Differential/Platelet, Comprehensive Metabolic Panel (CMET), TSH, Lipid Panel w/o Chol/HDL Ratio, PSA   Meds ordered this encounter  Medications  . sildenafil (VIAGRA) 50 MG tablet    Sig: Take 1 tablet (50 mg total) by mouth daily as needed for erectile dysfunction.    Dispense:  30 tablet    Refill:  0   Side effects and precautions of viagra advised. Check vital signs at home report to office.  Orders Placed This Encounter  Procedures  . CBC with Differential/Platelet   . Comprehensive Metabolic Panel (CMET)  . TSH  . Lipid Panel w/o Chol/HDL Ratio  . PSA  Advise an in person exam in the office as soon as he is able to come in- sooner the better discussed.   Red Flags discussed. The patient was given clear instructions to go to ER or return to medical center if any red flags develop, symptoms do not improve, worsen or new problems develop. They verbalized understanding.    Return in about 2 months (around 09/01/2020), or if symptoms worsen or fail to improve, for at any time for any worsening symptoms, Go to Emergency room/ urgent care if worse.     I discussed the assessment and treatment plan with the patient. The patient was provided an opportunity to ask questions and all were answered. The patient agreed with the plan and demonstrated an understanding of the instructions.   The patient was advised to call back or seek an in-person  evaluation if the symptoms worsen or if the condition fails to improve as anticipated.  I provided 30 minutes of non-face-to-face time during this encounter.  The entirety of the information documented in the History of Present Illness, Review of Systems and Physical Exam were personally obtained by me. Portions of this information were initially documented by the CMA and reviewed by me for thoroughness and accuracy.    I discussed the limitations of evaluation and management by telemedicine and the availability of in person appointments. The patient expressed understanding and agreed to proceed.   Marcille Buffy, [place] [phone] (phone) [phone] (fax)  [place]

## 2020-07-04 NOTE — Patient Instructions (Signed)
Erectile Dysfunction Erectile dysfunction (ED) is the inability to get or keep an erection in order to have sexual intercourse. ED is considered a symptom of an underlying disorder and not considered a disease. Erectile dysfunction may include:  Inability to get an erection.  Lack of enough hardness of the erection to allow penetration.  Loss of the erection before sex is finished. What are the causes? This condition may be caused by:  Certain medicines, such as: ? Pain relievers. ? Antihistamines. ? Antidepressants. ? Blood pressure medicines. ? Water pills (diuretics). ? Ulcer medicines. ? Muscle relaxants. ? Drugs.  Excessive drinking.  Psychological causes, such as: ? Anxiety. ? Depression. ? Sadness. ? Exhaustion. ? Performance fear. ? Stress.  Physical causes, such as: ? Artery problems. This may include diabetes, smoking, liver disease, or atherosclerosis. ? High blood pressure. ? Hormonal problems, such as low testosterone. ? Obesity. ? Nerve problems. This may include back or pelvic injuries, diabetes mellitus, multiple sclerosis, or Parkinson's disease. What are the signs or symptoms? Symptoms of this condition include:  Inability to get an erection.  Lack of enough hardness of the erection to allow penetration.  Loss of the erection before sex is finished.  Normal erections at some times, but with frequent unsatisfactory episodes.  Low sexual satisfaction in either partner due to erection problems.  A curved penis occurring with erection. The curve may cause pain or the penis may be too curved to allow for intercourse.  Never having nighttime erections. How is this diagnosed? This condition is often diagnosed by:  Performing a physical exam to find other diseases or specific problems with the penis.  Asking you detailed questions about the problem.  Performing blood tests to check for diabetes mellitus or to measure hormone levels.  Performing  other tests to check for underlying health conditions.  Performing an ultrasound exam to check for scarring.  Performing a test to check blood flow to the penis.  Doing a sleep study at home to measure nighttime erections. How is this treated? This condition may be treated by:  Medicine taken by mouth to help you achieve an erection (oral medicine).  Hormone replacement therapy to replace low testosterone levels.  Medicine that is injected into the penis. Your health care provider may instruct you how to give yourself these injections at home.  Vacuum pump. This is a pump with a ring on it. The pump and ring are placed on the penis and used to create pressure that helps the penis become erect.  Penile implant surgery. In this procedure, you may receive: ? An inflatable implant. This consists of cylinders, a pump, and a reservoir. The cylinders can be inflated with a fluid that helps to create an erection, and they can be deflated after intercourse. ? A semi-rigid implant. This consists of two silicone rubber rods. The rods provide some rigidity. They are also flexible, so the penis can both curve downward in its normal position and become straight for sexual intercourse.  Blood vessel surgery, to improve blood flow to the penis. During this procedure, a blood vessel from a different part of the body is placed into the penis to allow blood to flow around (bypass) damaged or blocked blood vessels.  Lifestyle changes, such as exercising more, losing weight, and quitting smoking. Follow these instructions at home: Medicines  Take over-the-counter and prescription medicines only as told by your health care provider. Do not increase the dosage without first discussing it with your health care   provider.  If you are using self-injections, perform injections as directed by your health care provider. Make sure to avoid any veins that are on the surface of the penis. After giving an injection,  apply pressure to the injection site for 5 minutes.   General instructions  Exercise regularly, as directed by your health care provider. Work with your health care provider to lose weight, if needed.  Do not use any products that contain nicotine or tobacco, such as cigarettes and e-cigarettes. If you need help quitting, ask your health care provider.  Before using a vacuum pump, read the instructions that come with the pump and discuss any questions with your health care provider.  Keep all follow-up visits as told by your health care provider. This is important. Contact a health care provider if:  You feel nauseous.  You vomit. Get help right away if:  You are taking oral or injectable medicines and you have an erection that lasts longer than 4 hours. If your health care provider is unavailable, go to the nearest emergency room for evaluation. An erection that lasts much longer than 4 hours can result in permanent damage to your penis.  You have severe pain in your groin or abdomen.  You develop redness or severe swelling of your penis.  You have redness spreading up into your groin or lower abdomen.  You are unable to urinate.  You experience chest pain or a rapid heart beat (palpitations) after taking oral medicines. Summary  Erectile dysfunction (ED) is the inability to get or keep an erection during sexual intercourse. This problem can usually be treated successfully.  This condition is diagnosed based on a physical exam, your symptoms, and tests to determine the cause. Treatment varies depending on the cause and may include medicines, hormone therapy, surgery, or a vacuum pump.  You may need follow-up visits to make sure that you are using your medicines or devices correctly.  Get help right away if you are taking or injecting medicines and you have an erection that lasts longer than 4 hours. This information is not intended to replace advice given to you by your health  care provider. Make sure you discuss any questions you have with your health care provider. Document Revised: 02/10/2020 Document Reviewed: 02/10/2020 Elsevier Patient Education  2021 [place]. Sildenafil tablets (Erectile Dysfunction) What is this medicine? SILDENAFIL (sil DEN a fil) is used to treat erection problems in men. This medicine may be used for other purposes; ask your health care provider or pharmacist if you have questions. COMMON BRAND NAME(S): Viagra What should I tell my health care provider before I take this medicine? They need to know if you have any of these conditions:  bleeding disorders  eye or vision problems, including a rare inherited eye disease called retinitis pigmentosa  anatomical deformation of the penis, Peyronie's disease, or history of priapism (painful and prolonged erection)  heart disease, angina, a history of heart attack, irregular heart beats, or other heart problems  high or low blood pressure  history of blood diseases, like sickle cell anemia or leukemia  history of stomach bleeding  kidney disease  liver disease  stroke  an unusual or allergic reaction to sildenafil, other medicines, foods, dyes, or preservatives  pregnant or trying to get pregnant  breast-feeding How should I use this medicine? Take this medicine by mouth with a glass of water. Follow the directions on the prescription label. The dose is usually taken 1 hour before sexual  activity. You should not take the dose more than once per day. Do not take your medicine more often than directed. Talk to your pediatrician regarding the use of this medicine in children. This medicine is not used in children for this condition. Overdosage: If you think you have taken too much of this medicine contact a poison control center or emergency room at once. NOTE: This medicine is only for you. Do not share this medicine with others. What if I miss a dose? This does not apply.  Do not take double or extra doses. What may interact with this medicine? Do not take this medicine with any of the following medications:  cisapride  nitrates like amyl nitrite, isosorbide dinitrate, isosorbide mononitrate, nitroglycerin  riociguat This medicine may also interact with the following medications:  antiviral medicines for HIV or AIDS  bosentan  certain medicines for benign prostatic hyperplasia (BPH)  certain medicines for blood pressure  certain medicines for fungal infections like ketoconazole and itraconazole  cimetidine  erythromycin  rifampin This list may not describe all possible interactions. Give your health care provider a list of all the medicines, herbs, non-prescription drugs, or dietary supplements you use. Also tell them if you smoke, drink alcohol, or use illegal drugs. Some items may interact with your medicine. What should I watch for while using this medicine? If you notice any changes in your vision while taking this drug, call your doctor or health care professional as soon as possible. Stop using this medicine and call your health care provider right away if you have a loss of sight in one or both eyes. Contact your doctor or health care professional right away if you have an erection that lasts longer than 4 hours or if it becomes painful. This may be a sign of a serious problem and must be treated right away to prevent permanent damage. If you experience symptoms of nausea, dizziness, chest pain or arm pain upon initiation of sexual activity after taking this medicine, you should refrain from further activity and call your doctor or health care professional as soon as possible. Do not drink alcohol to excess (examples, 5 glasses of wine or 5 shots of whiskey) when taking this medicine. When taken in excess, alcohol can increase your chances of getting a headache or getting dizzy, increasing your heart rate or lowering your blood pressure. Using  this medicine does not protect you or your partner against HIV infection (the virus that causes AIDS) or other sexually transmitted diseases. What side effects may I notice from receiving this medicine? Side effects that you should report to your doctor or health care professional as soon as possible:  allergic reactions like skin rash, itching or hives, swelling of the face, lips, or tongue  breathing problems  changes in hearing  changes in vision  chest pain  fast, irregular heartbeat  prolonged or painful erection  seizures Side effects that usually do not require medical attention (report to your doctor or health care professional if they continue or are bothersome):  back pain  dizziness  flushing  headache  indigestion  muscle aches  nausea  stuffy or runny nose This list may not describe all possible side effects. Call your doctor for medical advice about side effects. You may report side effects to FDA at 1-800-FDA-1088. Where should I keep my medicine? Keep out of reach of children. Store at room temperature between 15 and 30 degrees C (59 and 86 degrees F). Throw away any unused  medicine after the expiration date. NOTE: This sheet is a summary. It may not cover all possible information. If you have questions about this medicine, talk to your doctor, pharmacist, or health care provider.  2021 Elsevier/Gold Standard (2015-05-09 12:00:25)

## 2020-07-05 ENCOUNTER — Telehealth: Payer: Self-pay | Admitting: Adult Health

## 2020-07-05 NOTE — Telephone Encounter (Signed)
Im assuming patient only received 4 due to insurance purposes Ive seen this a lot with ED medication I guess to prevent overuse. KW

## 2020-07-05 NOTE — Telephone Encounter (Signed)
Yes they only approved that many as this is not what was sent by provider.

## 2020-07-05 NOTE — Telephone Encounter (Deleted)
Please disregard previous message. Advised the patient that the script was written for 30 pills.

## 2020-07-05 NOTE — Telephone Encounter (Addendum)
Patient is calling to ask a question regarding sildenafill. Sharyn Lull told the patient that she was going to write a script for 30 pills. Patient only received 4 pills. Please advise CB- [phone]  Please disregard the previous message. Patient was advised that the script was written for 30 pills.

## 2020-07-05 NOTE — Telephone Encounter (Signed)
Spoke with patient on the phone and he states that he is confused why he is no longer get 100mg  dosage. Patient reports he use to take 100mg  up to 4x , and wants to know if you will increase dosage back up? KW

## 2020-07-06 NOTE — Telephone Encounter (Signed)
Unable to reach patient at this time voicemailbox is full will try again at a later time. KW

## 2020-07-06 NOTE — Telephone Encounter (Signed)
Ok let patient know when he does call and we clarify that the dose sent is all we can send and that insurance determines amount given - he can send refill request when needed. If he needs more than what is prescribed would recommend him see urology. Ok to refer.

## 2020-07-06 NOTE — Telephone Encounter (Signed)
Nunzio Cory will you clarify with him, did he mean he was breaking the 100mg  pill into four pieces to have four doses ? He should only use once daily.

## 2020-09-26 ENCOUNTER — Telehealth: Payer: Self-pay

## 2020-09-26 DIAGNOSIS — N529 Male erectile dysfunction, unspecified: Secondary | ICD-10-CM

## 2020-09-26 NOTE — Telephone Encounter (Signed)
[facility] faxed refill request for the following medications:  sildenafil (VIAGRA) 50 MG tablet   Please advise.

## 2020-09-27 ENCOUNTER — Ambulatory Visit: Payer: BC Managed Care – PPO | Admitting: Dermatology

## 2020-09-27 MED ORDER — SILDENAFIL CITRATE 50 MG PO TABS: 50.0000 mg | ORAL_TABLET | Freq: Every day | ORAL | 1 refills | Status: DC | PRN

## 2020-10-05 ENCOUNTER — Ambulatory Visit: Payer: Self-pay

## 2020-10-05 NOTE — Telephone Encounter (Signed)
  Pt. Has noticed x 1 month swelling to top lip that comes and goes. No pain or itching. No dental issues. Declines virtual visit today. States he may go to UC.  Reason for Disposition . [1] Mild facial swelling (puffiness) AND [2] persists > 3 days  Answer Assessment - Initial Assessment Questions 1. ONSET: "When did the swelling start?" (e.g., minutes, hours, days)     1 month 2. LOCATION: "What part of the face is swollen?"     Lip - top 3. SEVERITY: "How swollen is it?"     Moderate 4. ITCHING: "Is there any itching?" If Yes, ask: "How much?"   (Scale 1-10; mild, moderate or severe)     No 5. PAIN: "Is the swelling painful to touch?" If Yes, ask: "How painful is it?"   (Scale 1-10; mild, moderate or severe)     No 6. FEVER: "Do you have a fever?" If Yes, ask: "What is it, how was it measured, and when did it start?"      No 7. CAUSE: "What do you think is causing the face swelling?"     Unsure 8. RECURRENT SYMPTOM: "Have you had face swelling before?" If Yes, ask: "When was the last time?" "What happened that time?"     No 9. OTHER SYMPTOMS: "Do you have any other symptoms?" (e.g., toothache, leg swelling)     No 10. PREGNANCY: "Is there any chance you are pregnant?" "When was your last menstrual period?"       n/a  Protocols used: [facility]

## 2020-10-05 NOTE — Telephone Encounter (Signed)
FYI

## 2020-11-12 ENCOUNTER — Telehealth: Payer: Self-pay

## 2020-11-12 NOTE — Telephone Encounter (Signed)
Spoke with patient on the phone and arranged mychart video visit for him to be evaluated by Dr. Caryn Section. KW

## 2020-11-12 NOTE — Telephone Encounter (Signed)
Would either see dentist or refer to telemedicine platform for consultation

## 2020-11-12 NOTE — Telephone Encounter (Signed)
Please advise 

## 2020-11-12 NOTE — Telephone Encounter (Signed)
Copied from [place] [phone]. Topic: General - Other >> Nov 12, 2020  9:20 AM Keene Breath wrote: Reason for CRM: Patient would like to know if he could get an antibiotic called in for his abscess in his tooth.  Explained to patient that the doctor might want to see him before calling in an antibiotic.  Patient understood but would like a call from the nurse to confirm.  CB# [phone]

## 2020-11-13 ENCOUNTER — Telehealth (INDEPENDENT_AMBULATORY_CARE_PROVIDER_SITE_OTHER): Payer: BC Managed Care – PPO | Admitting: Family Medicine

## 2020-11-13 DIAGNOSIS — K047 Periapical abscess without sinus: Secondary | ICD-10-CM | POA: Diagnosis not present

## 2020-11-13 MED ORDER — AMOXICILLIN-POT CLAVULANATE 875-125 MG PO TABS: 1.0000 | ORAL_TABLET | Freq: Two times a day (BID) | ORAL | 0 refills | Status: AC

## 2020-11-13 NOTE — Progress Notes (Signed)
MyChart Video Visit    Virtual Visit via Video Note   This visit type was conducted due to national recommendations for restrictions regarding the COVID-19 Pandemic (e.g. social distancing) in an effort to limit this patient's exposure and mitigate transmission in our community. This patient is at least at moderate risk for complications without adequate follow up. This format is felt to be most appropriate for this patient at this time. Physical exam was limited by quality of the video and audio technology used for the visit.   Patient location: home Provider location: [facility]  I discussed the limitations of evaluation and management by telemedicine and the availability of in person appointments. The patient expressed understanding and agreed to proceed.  Patient: Jacob Lee   DOB: [birth year]   60 y.o. Male  MRN: [id number] Visit Date: 11/13/2020  Today's healthcare provider: Lelon Huh, MD   Chief Complaint  Patient presents with  . Dental Pain   Subjective    Dental Pain  This is a new problem. The current episode started in the past 7 days. The problem occurs constantly. The problem has been unchanged. Associated symptoms include facial pain. Pertinent negatives include no fever or sinus pressure.    Front lower left tooth broke off and now gum is sore around broken tooth. Is unable to get in with Dentist this week. He states he had this several years ago and was treated with amoxicillin-clavulanate which he tolerating well without adverse reactions.   Allergies  Allergen Reactions  . Cefdinir     sleep disturbance  . Codeine Nausea Only  . Sudafed  [Pseudoephedrine Hcl] Rash    Medications: Outpatient Medications Prior to Visit  Medication Sig  . aspirin 81 MG tablet Take 1 tablet by mouth daily.  . benzonatate (TESSALON) 200 MG capsule Take 200 mg by mouth 3 (three) times daily as needed.  . doxycycline (VIBRA-TABS) 100 MG tablet Take 100 mg by mouth 2 (two)  times daily.  Marland Kitchen ibuprofen (ADVIL,MOTRIN) 800 MG tablet Take 1 tablet (800 mg total) by mouth every 8 (eight) hours as needed.  . mometasone (ELOCON) 0.1 % lotion Apply topically daily. To left external ear. May stop when symptoms resolve.  . predniSONE (STERAPRED UNI-PAK 21 TAB) 10 MG (21) TBPK tablet TAKE 6 TABLETS ON DAY 1 AS DIRECTED ON PACKAGE AND DECREASE BY 1 TAB EACH DAY FOR A TOTAL OF 6 DAYS  . sildenafil (VIAGRA) 50 MG tablet Take 1 tablet (50 mg total) by mouth daily as needed for erectile dysfunction.  Marland Kitchen esomeprazole (NEXIUM) 20 MG capsule Take by mouth.  Marland Kitchen lisinopril (PRINIVIL,ZESTRIL) 10 MG tablet TAKE 1 TABLET BY MOUTH EVERY DAY (Patient not taking: Reported on 09/28/2019)   No facility-administered medications prior to visit.    Review of Systems  Constitutional: Negative.  Negative for fever.  HENT: Positive for mouth sores. Negative for sinus pressure.   Neurological: Negative for dizziness, light-headedness and headaches.    Objective      Physical Exam   Awake, alert, oriented x 3. In no apparent distress   Assessment & Plan     1. Dental infection  - amoxicillin-clavulanate (AUGMENTIN) 875-125 MG tablet; Take 1 tablet by mouth 2 (two) times daily for 10 days.  Dispense: 20 tablet; Refill: 0   He is to follow up with dentist ASAP     I discussed the assessment and treatment plan with the patient. The patient was provided an opportunity to ask questions and  all were answered. The patient agreed with the plan and demonstrated an understanding of the instructions.   The patient was advised to call back or seek an in-person evaluation if the symptoms worsen or if the condition fails to improve as anticipated.  I provided 9 minutes of non-face-to-face time during this encounter.  The entirety of the information documented in the History of Present Illness, Review of Systems and Physical Exam were personally obtained by me. Portions of this information were  initially documented by the CMA and reviewed by me for thoroughness and accuracy.     Lelon Huh, MD [facility] [phone] (phone) [phone] (fax)  [place]

## 2021-10-13 ENCOUNTER — Other Ambulatory Visit: Payer: Self-pay | Admitting: Family Medicine

## 2021-10-13 DIAGNOSIS — N529 Male erectile dysfunction, unspecified: Secondary | ICD-10-CM

## 2021-10-25 DIAGNOSIS — Z125 Encounter for screening for malignant neoplasm of prostate: Secondary | ICD-10-CM | POA: Diagnosis not present

## 2021-10-25 DIAGNOSIS — Z131 Encounter for screening for diabetes mellitus: Secondary | ICD-10-CM | POA: Diagnosis not present

## 2021-10-25 DIAGNOSIS — Z1389 Encounter for screening for other disorder: Secondary | ICD-10-CM | POA: Diagnosis not present

## 2021-10-25 DIAGNOSIS — Z13220 Encounter for screening for lipoid disorders: Secondary | ICD-10-CM | POA: Diagnosis not present

## 2021-10-25 DIAGNOSIS — Z0000 Encounter for general adult medical examination without abnormal findings: Secondary | ICD-10-CM | POA: Diagnosis not present

## 2021-11-25 DIAGNOSIS — R300 Dysuria: Secondary | ICD-10-CM | POA: Diagnosis not present

## 2021-11-25 DIAGNOSIS — N3001 Acute cystitis with hematuria: Secondary | ICD-10-CM | POA: Diagnosis not present

## 2021-11-25 DIAGNOSIS — R490 Dysphonia: Secondary | ICD-10-CM | POA: Diagnosis not present

## 2021-11-25 DIAGNOSIS — R1310 Dysphagia, unspecified: Secondary | ICD-10-CM | POA: Diagnosis not present

## 2022-03-10 DIAGNOSIS — E781 Pure hyperglyceridemia: Secondary | ICD-10-CM | POA: Diagnosis not present

## 2022-03-10 DIAGNOSIS — E538 Deficiency of other specified B group vitamins: Secondary | ICD-10-CM | POA: Diagnosis not present

## 2022-04-17 ENCOUNTER — Ambulatory Visit: Payer: BC Managed Care – PPO | Admitting: Dermatology

## 2022-04-17 DIAGNOSIS — L821 Other seborrheic keratosis: Secondary | ICD-10-CM

## 2022-04-17 DIAGNOSIS — L820 Inflamed seborrheic keratosis: Secondary | ICD-10-CM

## 2022-04-17 DIAGNOSIS — Z1283 Encounter for screening for malignant neoplasm of skin: Secondary | ICD-10-CM | POA: Diagnosis not present

## 2022-04-17 DIAGNOSIS — L578 Other skin changes due to chronic exposure to nonionizing radiation: Secondary | ICD-10-CM

## 2022-04-17 DIAGNOSIS — L219 Seborrheic dermatitis, unspecified: Secondary | ICD-10-CM

## 2022-04-17 DIAGNOSIS — D229 Melanocytic nevi, unspecified: Secondary | ICD-10-CM

## 2022-04-17 DIAGNOSIS — L814 Other melanin hyperpigmentation: Secondary | ICD-10-CM | POA: Diagnosis not present

## 2022-04-17 MED ORDER — KETOCONAZOLE 2 % EX CREA: 1.0000 | TOPICAL_CREAM | Freq: Every day | CUTANEOUS | 11 refills | Status: DC

## 2022-04-17 MED ORDER — HYDROCORTISONE 2.5 % EX CREA: TOPICAL_CREAM | Freq: Every day | CUTANEOUS | 11 refills | Status: DC

## 2022-04-17 NOTE — Progress Notes (Signed)
Follow-Up Visit   Subjective  Jacob Lee is a 61 y.o. male who presents for the following: Other (Itchy spot of left forearm - The patient presents for Upper Body Skin Exam (UBSE) for skin cancer screening and mole check.  The patient has spots, moles and lesions to be evaluated, some may be new or changing and the patient has concerns that these could be cancer./).  The following portions of the chart were reviewed this encounter and updated as appropriate:   Tobacco  Allergies  Meds  Problems  Med Hx  Surg Hx  Fam Hx     Review of Systems:  No other skin or systemic complaints except as noted in HPI or Assessment and Plan.  Objective  Well appearing patient in no apparent distress; mood and affect are within normal limits.  All skin waist up examined.  Left Forearm - Posterior Erythematous stuck-on, waxy papule or plaque  Face, chest Pinkness and scale   Assessment & Plan   Lentigines - Scattered tan macules - Due to sun exposure - Benign-appearing, observe - Recommend daily broad spectrum sunscreen SPF 30+ to sun-exposed areas, reapply every 2 hours as needed. - Call for any changes  Seborrheic Keratoses - Stuck-on, waxy, tan-brown papules and/or plaques  - Benign-appearing - Discussed benign etiology and prognosis. - Observe - Call for any changes  Melanocytic Nevi - Tan-brown and/or pink-flesh-colored symmetric macules and papules - Benign appearing on exam today - Observation - Call clinic for new or changing moles - Recommend daily use of broad spectrum spf 30+ sunscreen to sun-exposed areas.   Hemangiomas - Red papules - Discussed benign nature - Observe - Call for any changes  Actinic Damage - Chronic condition, secondary to cumulative UV/sun exposure - diffuse scaly erythematous macules with underlying dyspigmentation - Recommend daily broad spectrum sunscreen SPF 30+ to sun-exposed areas, reapply every 2 hours as needed.  - Staying  in the shade or wearing long sleeves, sun glasses (UVA+UVB protection) and wide brim hats (4-inch brim around the entire circumference of the hat) are also recommended for sun protection.  - Call for new or changing lesions.  Skin cancer screening performed today.  Inflamed seborrheic keratosis Left Forearm - Posterior  Destruction of lesion - Left Forearm - Posterior Complexity: simple   Destruction method: cryotherapy   Informed consent: discussed and consent obtained   Timeout:  patient name, date of birth, surgical site, and procedure verified Lesion destroyed using liquid nitrogen: Yes   Region frozen until ice ball extended beyond lesion: Yes   Outcome: patient tolerated procedure well with no complications   Post-procedure details: wound care instructions given    Seborrheic dermatitis Face, chest  Seborrheic Dermatitis  -  is a chronic persistent rash characterized by pinkness and scaling most commonly of the mid face but also can occur on the scalp (dandruff), ears; mid chest, mid back and groin.  It tends to be exacerbated by stress and cooler weather.  People who have neurologic disease may experience new onset or exacerbation of existing seborrheic dermatitis.  The condition is not curable but treatable and can be controlled.   Start Ketoconazole 2% cream 3 nights a week, Monday, Wednesday and Fridays Start Hydrocortisone 2.5% cream/lotion 3 nights a week, Tuesday, Thursday and Saturdays   ketoconazole (NIZORAL) 2 % cream - Face, chest Apply 1 Application topically daily. Monday, Wednesday, Friday  hydrocortisone 2.5 % cream - Face, chest Apply topically daily. Tuesday, Thursday, Saturday   Return  in about 1 year (around 04/18/2023).  I, Ashok Cordia, CMA, am acting as scribe for Sarina Ser, MD . Documentation: I have reviewed the above documentation for accuracy and completeness, and I agree with the above.  Sarina Ser, MD

## 2022-04-17 NOTE — Patient Instructions (Addendum)
Start Ketoconazole 2% cream 3 nights a week, Monday, Wednesday and Fridays Start Hydrocortisone 2.5% cream/lotion 3 nights a week, Tuesday, Thursday and Saturdays    Cryotherapy Aftercare  Wash gently with soap and water everyday.   Apply Vaseline and Band-Aid daily until healed.    Due to recent changes in healthcare laws, you may see results of your pathology and/or laboratory studies on MyChart before the doctors have had a chance to review them. We understand that in some cases there may be results that are confusing or concerning to you. Please understand that not all results are received at the same time and often the doctors may need to interpret multiple results in order to provide you with the best plan of care or course of treatment. Therefore, we ask that you please give [place] 2 business days to thoroughly review all your results before contacting the office for clarification. Should we see a critical lab result, you will be contacted sooner.   If You Need Anything After Your Visit  If you have any questions or concerns for your doctor, please call our main line at [phone] and press option 4 to reach your doctor's medical assistant. If no one answers, please leave a voicemail as directed and we will return your call as soon as possible. Messages left after 4 pm will be answered the following business day.   You may also send [place] a message via [place]. We typically respond to MyChart messages within 1-2 business days.  For prescription refills, please ask your pharmacy to contact our office. Our fax number is [phone].  If you have an urgent issue when the clinic is closed that cannot wait until the next business day, you can page your doctor at the number below.    Please note that while we do our best to be available for urgent issues outside of office hours, we are not available 24/7.   If you have an urgent issue and are unable to reach [place], you may choose to seek medical  care at your doctor's office, retail clinic, urgent care center, or emergency room.  If you have a medical emergency, please immediately call 911 or go to the emergency department.  Pager Numbers  - Dr. Nehemiah Massed: [phone]  - Dr. Laurence Ferrari: [phone]  - Dr. Nicole Kindred: [phone]  In the event of inclement weather, please call our main line at [phone] for an update on the status of any delays or closures.  Dermatology Medication Tips: Please keep the boxes that topical medications come in in order to help keep track of the instructions about where and how to use these. Pharmacies typically print the medication instructions only on the boxes and not directly on the medication tubes.   If your medication is too expensive, please contact our office at [phone] option 4 or send [place] a message through [place].   We are unable to tell what your co-pay for medications will be in advance as this is different depending on your insurance coverage. However, we may be able to find a substitute medication at lower cost or fill out paperwork to get insurance to cover a needed medication.   If a prior authorization is required to get your medication covered by your insurance company, please allow [place] 1-2 business days to complete this process.  Drug prices often vary depending on where the prescription is filled and some pharmacies may offer cheaper prices.  The website www.goodrx.com contains coupons for medications through different pharmacies. The  prices here do not account for what the cost may be with help from insurance (it may be cheaper with your insurance), but the website can give you the price if you did not use any insurance.  - You can print the associated coupon and take it with your prescription to the pharmacy.  - You may also stop by our office during regular business hours and pick up a GoodRx coupon card.  - If you need your prescription sent electronically to a different  pharmacy, notify our office through [facility] or by phone at [phone] option 4.     Si Usted Necesita Algo Despus de Su Visita  Tambin puede enviarnos un mensaje a travs de Pharmacist, community. Por lo general respondemos a los mensajes de MyChart en el transcurso de 1 a 2 das hbiles.  Para renovar recetas, por favor pida a su farmacia que se ponga en contacto con nuestra oficina. Harland Dingwall de fax es [place] [phone].  Si tiene un asunto urgente cuando la clnica est cerrada y que no puede esperar hasta el siguiente da hbil, puede llamar/localizar a su doctor(a) al nmero que aparece a continuacin.   Por favor, tenga en cuenta que aunque hacemos todo lo posible para estar disponibles para asuntos urgentes fuera del horario de [place], no estamos disponibles las 24 horas del da, los 7 das de la [place].   Si tiene un problema urgente y no puede comunicarse con nosotros, puede optar por buscar atencin mdica  en el consultorio de su doctor(a), en una clnica privada, en un centro de atencin urgente o en una sala de emergencias.  Si tiene Engineering geologist, por favor llame inmediatamente al 911 o vaya a la sala de emergencias.  Nmeros de bper  - Dr. Nehemiah Massed: [phone]  - Dra. Moye: [phone]  - Dra. Nicole Kindred: [phone]  En caso de inclemencias del [place], por favor llame a Johnsie Kindred principal al [phone] para una actualizacin sobre el [place] de cualquier retraso o cierre.  Consejos para la medicacin en dermatologa: Por favor, guarde las cajas en las que vienen los medicamentos de uso tpico para ayudarle a seguir las instrucciones sobre dnde y cmo usarlos. Las farmacias generalmente imprimen las instrucciones del medicamento slo en las cajas y no directamente en los tubos del [place].   Si su medicamento es muy caro, por favor, pngase en contacto con Zigmund Daniel llamando al [phone] y presione la opcin 4 o envenos un mensaje a  travs de Pharmacist, community.   No podemos decirle cul ser su copago por los medicamentos por adelantado ya que esto es diferente dependiendo de la cobertura de su seguro. Sin embargo, es posible que podamos encontrar un medicamento sustituto a Electrical engineer un formulario para que el seguro cubra el medicamento que se considera necesario.   Si se requiere una autorizacin previa para que su compaa de seguros [place] su medicamento, por favor permtanos de 1 a 2 das hbiles para completar este proceso.  Los precios de los medicamentos varan con frecuencia dependiendo del Environmental consultant de dnde se surte la receta y alguna farmacias pueden ofrecer precios ms baratos.  El sitio web www.goodrx.com tiene cupones para medicamentos de Airline pilot. Los precios aqu no tienen en cuenta lo que podra costar con la ayuda del seguro (puede ser ms barato con su seguro), pero el sitio web puede darle el precio si no utiliz Research scientist (physical sciences).  - Puede imprimir el cupn correspondiente y llevarlo con su receta a  la farmacia.  - Tambin puede pasar por nuestra oficina durante el horario de atencin regular y Charity fundraiser una tarjeta de cupones de GoodRx.  - Si necesita que su receta se enve electrnicamente a una farmacia diferente, informe a nuestra oficina a travs de MyChart de [place] o por telfono llamando al [phone] y presione la opcin 4.

## 2022-04-26 ENCOUNTER — Encounter: Payer: Self-pay | Admitting: Dermatology

## 2023-04-14 ENCOUNTER — Ambulatory Visit: Payer: BC Managed Care – PPO | Admitting: Dermatology

## 2023-04-14 ENCOUNTER — Encounter: Payer: Self-pay | Admitting: Dermatology

## 2023-04-14 DIAGNOSIS — L578 Other skin changes due to chronic exposure to nonionizing radiation: Secondary | ICD-10-CM

## 2023-04-14 DIAGNOSIS — L814 Other melanin hyperpigmentation: Secondary | ICD-10-CM | POA: Diagnosis not present

## 2023-04-14 DIAGNOSIS — L219 Seborrheic dermatitis, unspecified: Secondary | ICD-10-CM | POA: Diagnosis not present

## 2023-04-14 DIAGNOSIS — L821 Other seborrheic keratosis: Secondary | ICD-10-CM | POA: Diagnosis not present

## 2023-04-14 DIAGNOSIS — D229 Melanocytic nevi, unspecified: Secondary | ICD-10-CM

## 2023-04-14 DIAGNOSIS — Z1283 Encounter for screening for malignant neoplasm of skin: Secondary | ICD-10-CM

## 2023-04-14 DIAGNOSIS — W098XXA Fall on or from other playground equipment, initial encounter: Secondary | ICD-10-CM

## 2023-04-14 DIAGNOSIS — D1801 Hemangioma of skin and subcutaneous tissue: Secondary | ICD-10-CM

## 2023-04-14 MED ORDER — KETOCONAZOLE 2 % EX CREA: 1.0000 | TOPICAL_CREAM | Freq: Every day | CUTANEOUS | 11 refills | Status: AC

## 2023-04-14 MED ORDER — HYDROCORTISONE 2.5 % EX CREA: TOPICAL_CREAM | Freq: Every day | CUTANEOUS | 11 refills | Status: DC

## 2023-04-14 NOTE — Progress Notes (Signed)
   Follow-Up Visit   Subjective  Jacob Lee is a 62 y.o. male who presents for the following: Skin Cancer Screening and Upper Body Skin Exam  The patient presents for Upper Body Skin Exam (UBSE) for skin cancer screening and mole check. The patient has spots, moles and lesions to be evaluated, some may be new or changing and the patient may have concern these could be cancer.  Patient with hx of seb derm. Uses ketoconazole 2% cream and HC 2.5% cream, controlled. Patient c/o spot at left shoulder, tender, present for about 3 months.   The following portions of the chart were reviewed this encounter and updated as appropriate: medications, allergies, medical history  Review of Systems:  No other skin or systemic complaints except as noted in HPI or Assessment and Plan.  Objective  Well appearing patient in no apparent distress; mood and affect are within normal limits.  All skin waist up and legs examined. Relevant physical exam findings are noted in the Assessment and Plan.    Assessment & Plan   Seborrheic dermatitis  Related Medications ketoconazole (NIZORAL) 2 % cream Apply 1 Application topically daily. Monday, Wednesday, Friday  hydrocortisone 2.5 % cream Apply topically daily. Tuesday, Thursday, Saturday   Skin cancer screening performed today.  Actinic Damage - Chronic condition, secondary to cumulative UV/sun exposure - diffuse scaly erythematous macules with underlying dyspigmentation - Recommend daily broad spectrum sunscreen SPF 30+ to sun-exposed areas, reapply every 2 hours as needed.  - Staying in the shade or wearing long sleeves, sun glasses (UVA+UVB protection) and wide brim hats (4-inch brim around the entire circumference of the hat) are also recommended for sun protection.  - Call for new or changing lesions.  Lentigines, Seborrheic Keratoses, Hemangiomas - Benign normal skin lesions - Benign-appearing - Call for any changes  Melanocytic  Nevi - Tan-brown and/or pink-flesh-colored symmetric macules and papules - Benign appearing on exam today - Observation - Call clinic for new or changing moles - Recommend daily use of broad spectrum spf 30+ sunscreen to sun-exposed areas.   SEBORRHEIC DERMATITIS Exam: Pink patches with greasy scale at peri-alar and upper cutaneous lip  Chronic condition with duration or expected duration over one year. Mildly flaring but responsive to medication.   Seborrheic Dermatitis is a chronic persistent rash characterized by pinkness and scaling most commonly of the mid face but also can occur on the scalp (dandruff), ears; mid chest, mid back and groin.  It tends to be exacerbated by stress and cooler weather.  People who have neurologic disease may experience new onset or exacerbation of existing seborrheic dermatitis.  The condition is not curable but treatable and can be controlled.  Treatment Plan: Continue Ketoconazole 2% cream 3 nights a week, Monday, Wednesday and Fridays Continue Hydrocortisone 2.5% cream/lotion 3 nights a week, Tuesday, Thursday and Saturdays    Return in about 1 year (around 04/13/2024) for UBSE, Seb Derm.  Anise Salvo, RMA, am acting as scribe for Elie Goody, MD .   Documentation: I have reviewed the above documentation for accuracy and completeness, and I agree with the above.  Elie Goody, MD

## 2023-04-14 NOTE — Patient Instructions (Signed)

## 2023-04-22 ENCOUNTER — Ambulatory Visit: Payer: BC Managed Care – PPO | Admitting: Dermatology

## 2024-04-11 ENCOUNTER — Ambulatory Visit: Admitting: Dermatology

## 2024-04-14 ENCOUNTER — Ambulatory Visit: Payer: BC Managed Care – PPO | Admitting: Dermatology

## 2024-04-15 ENCOUNTER — Other Ambulatory Visit: Payer: Self-pay | Admitting: Internal Medicine

## 2024-04-15 DIAGNOSIS — E782 Mixed hyperlipidemia: Secondary | ICD-10-CM

## 2024-04-20 ENCOUNTER — Ambulatory Visit
Admission: RE | Admit: 2024-04-20 | Discharge: 2024-04-20 | Disposition: A | Discharge status: Home / Self Care | Payer: Self-pay | Source: Ambulatory Visit | Attending: Internal Medicine | Admitting: Internal Medicine

## 2024-04-20 DIAGNOSIS — E782 Mixed hyperlipidemia: Secondary | ICD-10-CM | POA: Insufficient documentation

## 2024-05-18 ENCOUNTER — Encounter: Payer: Self-pay | Admitting: Dermatology

## 2024-05-18 ENCOUNTER — Ambulatory Visit (INDEPENDENT_AMBULATORY_CARE_PROVIDER_SITE_OTHER): Admitting: Dermatology

## 2024-05-18 DIAGNOSIS — Z79899 Other long term (current) drug therapy: Secondary | ICD-10-CM

## 2024-05-18 DIAGNOSIS — W908XXA Exposure to other nonionizing radiation, initial encounter: Secondary | ICD-10-CM

## 2024-05-18 DIAGNOSIS — L219 Seborrheic dermatitis, unspecified: Secondary | ICD-10-CM

## 2024-05-18 DIAGNOSIS — L578 Other skin changes due to chronic exposure to nonionizing radiation: Secondary | ICD-10-CM

## 2024-05-18 DIAGNOSIS — Z7189 Other specified counseling: Secondary | ICD-10-CM

## 2024-05-18 DIAGNOSIS — L820 Inflamed seborrheic keratosis: Secondary | ICD-10-CM | POA: Diagnosis not present

## 2024-05-18 DIAGNOSIS — L718 Other rosacea: Secondary | ICD-10-CM | POA: Diagnosis not present

## 2024-05-18 DIAGNOSIS — D1801 Hemangioma of skin and subcutaneous tissue: Secondary | ICD-10-CM

## 2024-05-18 DIAGNOSIS — L821 Other seborrheic keratosis: Secondary | ICD-10-CM | POA: Diagnosis not present

## 2024-05-18 DIAGNOSIS — L814 Other melanin hyperpigmentation: Secondary | ICD-10-CM | POA: Diagnosis not present

## 2024-05-18 DIAGNOSIS — D229 Melanocytic nevi, unspecified: Secondary | ICD-10-CM

## 2024-05-18 DIAGNOSIS — L711 Rhinophyma: Secondary | ICD-10-CM

## 2024-05-18 DIAGNOSIS — Z1283 Encounter for screening for malignant neoplasm of skin: Secondary | ICD-10-CM

## 2024-05-18 DIAGNOSIS — L719 Rosacea, unspecified: Secondary | ICD-10-CM

## 2024-05-18 MED ORDER — HYDROCORTISONE 2.5 % EX CREA: TOPICAL_CREAM | Freq: Every day | CUTANEOUS | 11 refills | Status: AC

## 2024-05-18 MED ORDER — METRONIDAZOLE 0.75 % EX GEL: CUTANEOUS | 11 refills | Status: AC

## 2024-05-18 NOTE — Progress Notes (Signed)
 Follow-Up Visit   Subjective  Jacob Lee is a 63 y.o. male who presents for the following: Skin Cancer Screening and Upper Body Skin Exam. No hx of skin cancer or dysplastic nevi.  He reports a painful black spot on his back that his wife saw about 2 months ago.   Has seborrheic dermatitis. Uses Ketoconazole  2% and HC 2.5% cream as directed/as needed.   The patient presents for Upper Body Skin Exam (UBSE) for skin cancer screening and mole check. The patient has spots, moles and lesions to be evaluated, some may be new or changing and the patient may have concern these could be cancer.  The following portions of the chart were reviewed this encounter and updated as appropriate: medications, allergies, medical history  Review of Systems:  No other skin or systemic complaints except as noted in HPI or Assessment and Plan.  Objective  Well appearing patient in no apparent distress; mood and affect are within normal limits.  All skin waist up examined. Relevant physical exam findings are noted in the Assessment and Plan.  Back x3, R ant shoulder x3 (6) Erythematous keratotic or waxy stuck-on papule or plaque.  Assessment & Plan   SEBORRHEIC DERMATITIS   Related Medications ketoconazole  (NIZORAL ) 2 % cream Apply 1 Application topically daily. Monday, Wednesday, Friday hydrocortisone  2.5 % cream Apply topically daily. Tuesday, Thursday, Saturday for seborrheic dermatitis INFLAMED SEBORRHEIC KERATOSIS (6) Back x3, R ant shoulder x3 (6) Symptomatic, irritating, patient would like treated. Destruction of lesion - Back x3, R ant shoulder x3 (6) Complexity: simple   Destruction method: cryotherapy   Informed consent: discussed and consent obtained   Timeout:  patient name, date of birth, surgical site, and procedure verified Lesion destroyed using liquid nitrogen: Yes   Region frozen until ice ball extended beyond lesion: Yes   Outcome: patient tolerated procedure well with  no complications   Post-procedure details: wound care instructions given   Additional details:  Prior to procedure, discussed risks of blister formation, small wound, skin dyspigmentation, or rare scar following cryotherapy. Recommend Vaseline ointment to treated areas while healing.   SKIN CANCER SCREENING   ACTINIC SKIN DAMAGE   LENTIGO   MELANOCYTIC NEVUS, UNSPECIFIED LOCATION   SEBORRHEIC KERATOSIS   ROSACEA   COUNSELING AND COORDINATION OF CARE   MEDICATION MANAGEMENT    Skin cancer screening performed today.  Actinic Damage - Chronic condition, secondary to cumulative UV/sun exposure - diffuse scaly erythematous macules with underlying dyspigmentation - Recommend daily broad spectrum sunscreen SPF 30+ to sun-exposed areas, reapply every 2 hours as needed.  - Staying in the shade or wearing long sleeves, sun glasses (UVA+UVB protection) and wide brim hats (4-inch brim around the entire circumference of the hat) are also recommended for sun protection.  - Call for new or changing lesions.  Lentigines, Seborrheic Keratoses, Hemangiomas - Benign normal skin lesions - Benign-appearing - Call for any changes  Melanocytic Nevi - Tan-brown and/or pink-flesh-colored symmetric macules and papules - Benign appearing on exam today - Observation - Call clinic for new or changing moles - Recommend daily use of broad spectrum spf 30+ sunscreen to sun-exposed areas.    SEBORRHEIC DERMATITIS Exam: Pink patches with greasy scale at peri-alar and upper cutaneous lip Chronic condition with duration or expected duration over one year. Mildly flaring but responsive to medication. Seborrheic Dermatitis is a chronic persistent rash characterized by pinkness and scaling most commonly of the mid face but also can occur on the scalp (  dandruff), ears; mid chest, mid back and groin.  It tends to be exacerbated by stress and cooler weather.  People who have neurologic disease may  experience new onset or exacerbation of existing seborrheic dermatitis.  The condition is not curable but treatable and can be controlled. Treatment Plan: Continue Ketoconazole  2% cream 3 nights a week, Monday, Wednesday and Fridays for seborrheic dermatitis Continue Hydrocortisone  2.5% cream/lotion 3 nights a week, Tuesday, Thursday and Saturdays for seborrheic dermatitis.   ROSACEA, ocular rosacea and rhinophyma Exam Mid face erythema with telangiectasias, erythema of eyes.  Mild thickening of the skin of the nose Chronic and persistent condition with duration or expected duration over one year. Condition is bothersome/symptomatic for patient. Currently flared. Rosacea is a chronic progressive skin condition usually affecting the face of adults, causing redness and/or acne bumps. It is treatable but not curable. It sometimes affects the eyes (ocular rosacea) as well. It may respond to topical and/or systemic medication and can flare with stress, sun exposure, alcohol, exercise, topical steroids (including hydrocortisone /cortisone 10) and some foods.  Daily application of broad spectrum spf 30+ sunscreen to face is recommended to reduce flares.  Patient denies grittiness of the eyes  Treatment Plan Apply metronidazole 0.75% gel at bedtime or twice a day to affected areas at face for rosacea.   See ophthalmologist if eyes become symptomatic.   Return in about 1 year (around 05/18/2025) for TBSE.  I, Kate Fought, CMA, am acting as scribe for Alm Rhyme, MD.   Documentation: I have reviewed the above documentation for accuracy and completeness, and I agree with the above.  Alm Rhyme, MD

## 2024-05-18 NOTE — Patient Instructions (Addendum)
 Continue Ketoconazole  2% cream 3 nights a week, Monday, Wednesday and Fridays Continue Hydrocortisone  2.5% cream/lotion 3 nights a week, Tuesday, Thursday and Saturdays   Apply metronidazole 0.75% gel at bedtime or twice a day to affected areas at face for rosacea.   Cryotherapy Aftercare  Wash gently with soap and water everyday.   Apply Vaseline Jelly daily until healed.   Recommend daily broad spectrum sunscreen SPF 30+ to sun-exposed areas, reapply every 2 hours as needed. Call for new or changing lesions.  Staying in the shade or wearing long sleeves, sun glasses (UVA+UVB protection) and wide brim hats (4-inch brim around the entire circumference of the hat) are also recommended for sun protection.      Melanoma ABCDEs  Melanoma is the most dangerous type of skin cancer, and is the leading cause of death from skin disease.  You are more likely to develop melanoma if you: Have light-colored skin, light-colored eyes, or red or blond hair Spend a lot of time in the sun Tan regularly, either outdoors or in a tanning bed Have had blistering sunburns, especially during childhood Have a close family member who has had a melanoma Have atypical moles or large birthmarks  Early detection of melanoma is key since treatment is typically straightforward and cure rates are extremely high if we catch it early.   The first sign of melanoma is often a change in a mole or a new dark spot.  The ABCDE system is a way of remembering the signs of melanoma.  A for asymmetry:  The two halves do not match. B for border:  The edges of the growth are irregular. C for color:  A mixture of colors are present instead of an even brown color. D for diameter:  Melanomas are usually (but not always) greater than 6mm - the size of a pencil eraser. E for evolution:  The spot keeps changing in size, shape, and color.  Please check your skin once per month between visits. You can use a small mirror in front and a  large mirror behind you to keep an eye on the back side or your body.   If you see any new or changing lesions before your next follow-up, please call to schedule a visit.  Please continue daily skin protection including broad spectrum sunscreen SPF 30+ to sun-exposed areas, reapplying every 2 hours as needed when you're outdoors.   Staying in the shade or wearing long sleeves, sun glasses (UVA+UVB protection) and wide brim hats (4-inch brim around the entire circumference of the hat) are also recommended for sun protection.      Due to recent changes in healthcare laws, you may see results of your pathology and/or laboratory studies on MyChart before the doctors have had a chance to review them. We understand that in some cases there may be results that are confusing or concerning to you. Please understand that not all results are received at the same time and often the doctors may need to interpret multiple results in order to provide you with the best plan of care or course of treatment. Therefore, we ask that you please give us  2 business days to thoroughly review all your results before contacting the office for clarification. Should we see a critical lab result, you will be contacted sooner.   If You Need Anything After Your Visit  If you have any questions or concerns for your doctor, please call our main line at [phone] and press option 4 to  reach your doctor's medical assistant. If no one answers, please leave a voicemail as directed and we will return your call as soon as possible. Messages left after 4 pm will be answered the following business day.   You may also send us  a message via MyChart. We typically respond to MyChart messages within 1-2 business days.  For prescription refills, please ask your pharmacy to contact our office. Our fax number is [phone].  If you have an urgent issue when the clinic is closed that cannot wait until the next business day, you can page  your doctor at the number below.    Please note that while we do our best to be available for urgent issues outside of office hours, we are not available 24/7.   If you have an urgent issue and are unable to reach us , you may choose to seek medical care at your doctor's office, retail clinic, urgent care center, or emergency room.  If you have a medical emergency, please immediately call 911 or go to the emergency department.  Pager Numbers  - Dr. Hester: [phone]  - Dr. Jackquline: [phone]  - Dr. Claudene: [phone]   - Dr. Raymund: [phone]  In the event of inclement weather, please call our main line at [phone] for an update on the status of any delays or closures.  Dermatology Medication Tips: Please keep the boxes that topical medications come in in order to help keep track of the instructions about where and how to use these. Pharmacies typically print the medication instructions only on the boxes and not directly on the medication tubes.   If your medication is too expensive, please contact our office at [phone] option 4 or send us  a message through MyChart.   We are unable to tell what your co-pay for medications will be in advance as this is different depending on your insurance coverage. However, we may be able to find a substitute medication at lower cost or fill out paperwork to get insurance to cover a needed medication.   If a prior authorization is required to get your medication covered by your insurance company, please allow us  1-2 business days to complete this process.  Drug prices often vary depending on where the prescription is filled and some pharmacies may offer cheaper prices.  The website www.goodrx.com contains coupons for medications through different pharmacies. The prices here do not account for what the cost may be with help from insurance (it may be cheaper with your insurance), but the website can give you the price if you did not  use any insurance.  - You can print the associated coupon and take it with your prescription to the pharmacy.  - You may also stop by our office during regular business hours and pick up a GoodRx coupon card.  - If you need your prescription sent electronically to a different pharmacy, notify our office through [facility] or by phone at [phone] option 4.     Si Usted Necesita Algo Despus de Su Visita  Tambin puede enviarnos un mensaje a travs de Clinical Cytogeneticist. Por lo general respondemos a los mensajes de MyChart en el transcurso de 1 a 2 das hbiles.  Para renovar recetas, por favor pida a su farmacia que se ponga en contacto con nuestra oficina. Randi lakes de fax es [place] [phone].  Si tiene un asunto urgente cuando la clnica est cerrada y que no puede esperar hasta el siguiente da hbil, puede llamar/localizar a su doctor(a)  al nmero que aparece a continuacin.   Por favor, tenga en cuenta que aunque hacemos todo lo posible para estar disponibles para asuntos urgentes fuera del horario de [place], no estamos disponibles las 24 horas del da, los 7 [address]  [address] de la [place].   Si tiene un problema urgente y no puede comunicarse con nosotros, puede optar por buscar atencin mdica  en el consultorio de su doctor(a), en una clnica privada, en un centro de atencin urgente o en una sala de emergencias.  Si tiene engineer, drilling, por favor llame inmediatamente al 911 o vaya a la sala de emergencias.  Nmeros de bper  - Dr. Hester: [phone]  - Dra. Jackquline: [phone]  - Dr. Claudene: [phone]  - Dra. Kitts: [phone]  En caso de inclemencias del [place], por favor llame a nuestra lnea principal al [phone] para una actualizacin sobre el estado de cualquier retraso o cierre.  Consejos para la medicacin en dermatologa: Por favor, guarde las cajas en las que vienen los medicamentos de uso tpico para ayudarle a seguir las instrucciones sobre dnde  y cmo usarlos. Las farmacias generalmente imprimen las instrucciones del medicamento slo en las cajas y no directamente en los tubos del [place].   Si su medicamento es muy caro, por favor, pngase en contacto con landry rieger llamando al [phone] y presione la opcin 4 o envenos un mensaje a travs de Clinical Cytogeneticist.   No podemos decirle cul ser su copago por los medicamentos por adelantado ya que esto es diferente dependiendo de la cobertura de su seguro. Sin embargo, es posible que podamos encontrar un medicamento sustituto a audiological scientist un formulario para que el seguro cubra el medicamento que se considera necesario.   Si se requiere una autorizacin previa para que su compaa de seguros cubra su medicamento, por favor permtanos de 1 a 2 das hbiles para completar este proceso.  Los precios de los medicamentos varan con frecuencia dependiendo del environmental consultant de dnde se surte la receta y alguna farmacias pueden ofrecer precios ms baratos.  El sitio web www.goodrx.com tiene cupones para medicamentos de health and safety inspector. Los precios aqu no tienen en cuenta lo que podra costar con la ayuda del seguro (puede ser ms barato con su seguro), pero el sitio web puede darle el precio si no utiliz tourist information centre manager.  - Puede imprimir el cupn correspondiente y llevarlo con su receta a la farmacia.  - Tambin puede pasar por nuestra oficina durante el horario de atencin regular y education officer, museum una tarjeta de cupones de GoodRx.  - Si necesita que su receta se enve electrnicamente a una farmacia diferente, informe a nuestra oficina a travs de MyChart de [place] o por telfono llamando al [phone] y presione la opcin 4.

## 2025-05-23 ENCOUNTER — Ambulatory Visit: Admitting: Dermatology
